# Patient Record
Sex: Female | Born: 1975 | Race: Black or African American | Hispanic: No | Smoking: Never smoker
Health system: Southern US, Community
[De-identification: ages and names within clinical notes are randomized; demographics above are authoritative.]

## PROBLEM LIST (undated history)

## (undated) DIAGNOSIS — N83209 Unspecified ovarian cyst, unspecified side: Secondary | ICD-10-CM

## (undated) DIAGNOSIS — A749 Chlamydial infection, unspecified: Secondary | ICD-10-CM

## (undated) DIAGNOSIS — N809 Endometriosis, unspecified: Secondary | ICD-10-CM

## (undated) DIAGNOSIS — R102 Pelvic and perineal pain: Secondary | ICD-10-CM

## (undated) DIAGNOSIS — T8859XA Other complications of anesthesia, initial encounter: Secondary | ICD-10-CM

## (undated) DIAGNOSIS — A599 Trichomoniasis, unspecified: Secondary | ICD-10-CM

## (undated) DIAGNOSIS — D649 Anemia, unspecified: Secondary | ICD-10-CM

## (undated) DIAGNOSIS — T4145XA Adverse effect of unspecified anesthetic, initial encounter: Secondary | ICD-10-CM

## (undated) HISTORY — PX: WISDOM TOOTH EXTRACTION: SHX21

## (undated) HISTORY — PX: BREAST LUMPECTOMY: SHX2

## (undated) HISTORY — PX: BREAST SURGERY: SHX581

---

## 2003-06-11 ENCOUNTER — Encounter: Admission: RE | Admit: 2003-06-11 | Discharge: 2003-06-11 | Payer: Self-pay | Admitting: Internal Medicine

## 2003-07-22 ENCOUNTER — Encounter: Admission: RE | Admit: 2003-07-22 | Discharge: 2003-07-22 | Payer: Self-pay | Admitting: Internal Medicine

## 2003-08-16 ENCOUNTER — Encounter: Admission: RE | Admit: 2003-08-16 | Discharge: 2003-08-16 | Payer: Self-pay | Admitting: Internal Medicine

## 2003-08-18 ENCOUNTER — Encounter: Admission: RE | Admit: 2003-08-18 | Discharge: 2003-08-18 | Payer: Self-pay | Admitting: Internal Medicine

## 2003-08-24 ENCOUNTER — Encounter: Admission: RE | Admit: 2003-08-24 | Discharge: 2003-08-24 | Payer: Self-pay | Admitting: Internal Medicine

## 2003-09-08 ENCOUNTER — Encounter: Admission: RE | Admit: 2003-09-08 | Discharge: 2003-09-08 | Payer: Self-pay | Admitting: Internal Medicine

## 2004-05-17 ENCOUNTER — Ambulatory Visit: Payer: Self-pay | Admitting: Internal Medicine

## 2004-05-22 ENCOUNTER — Ambulatory Visit: Payer: Self-pay | Admitting: Internal Medicine

## 2004-10-12 ENCOUNTER — Ambulatory Visit: Payer: Self-pay | Admitting: Internal Medicine

## 2004-12-20 ENCOUNTER — Emergency Department (HOSPITAL_COMMUNITY): Admission: EM | Admit: 2004-12-20 | Discharge: 2004-12-20 | Payer: Self-pay | Admitting: Emergency Medicine

## 2004-12-28 ENCOUNTER — Ambulatory Visit: Payer: Self-pay | Admitting: Internal Medicine

## 2006-04-26 ENCOUNTER — Other Ambulatory Visit: Admission: RE | Admit: 2006-04-26 | Discharge: 2006-04-26 | Payer: Self-pay | Admitting: Family Medicine

## 2007-07-04 ENCOUNTER — Emergency Department (HOSPITAL_COMMUNITY): Admission: EM | Admit: 2007-07-04 | Discharge: 2007-07-04 | Payer: Self-pay | Admitting: Emergency Medicine

## 2007-11-12 ENCOUNTER — Emergency Department (HOSPITAL_COMMUNITY): Admission: EM | Admit: 2007-11-12 | Discharge: 2007-11-12 | Payer: Self-pay | Admitting: Emergency Medicine

## 2008-06-15 ENCOUNTER — Other Ambulatory Visit: Admission: RE | Admit: 2008-06-15 | Discharge: 2008-06-15 | Payer: Self-pay | Admitting: Obstetrics and Gynecology

## 2009-11-30 ENCOUNTER — Encounter: Admission: RE | Admit: 2009-11-30 | Discharge: 2009-11-30 | Payer: Self-pay | Admitting: Internal Medicine

## 2010-01-06 ENCOUNTER — Emergency Department (HOSPITAL_COMMUNITY)
Admission: EM | Admit: 2010-01-06 | Discharge: 2010-01-07 | Payer: Self-pay | Source: Home / Self Care | Admitting: Emergency Medicine

## 2010-04-29 ENCOUNTER — Ambulatory Visit (HOSPITAL_COMMUNITY): Admission: RE | Admit: 2010-04-29 | Discharge: 2010-04-29 | Payer: Self-pay | Admitting: Family Medicine

## 2010-08-15 ENCOUNTER — Emergency Department (HOSPITAL_COMMUNITY)
Admission: EM | Admit: 2010-08-15 | Discharge: 2010-08-15 | Payer: Self-pay | Source: Home / Self Care | Admitting: Emergency Medicine

## 2010-10-23 LAB — URINALYSIS, ROUTINE W REFLEX MICROSCOPIC
Bilirubin Urine: NEGATIVE
Glucose, UA: NEGATIVE mg/dL
Hgb urine dipstick: NEGATIVE
Ketones, ur: NEGATIVE mg/dL
Nitrite: NEGATIVE
Protein, ur: NEGATIVE mg/dL
Specific Gravity, Urine: 1.031 — ABNORMAL HIGH (ref 1.005–1.030)
Urobilinogen, UA: 1 mg/dL (ref 0.0–1.0)
pH: 6.5 (ref 5.0–8.0)

## 2010-10-23 LAB — WET PREP, GENITAL
Clue Cells Wet Prep HPF POC: NONE SEEN
Trich, Wet Prep: NONE SEEN
Yeast Wet Prep HPF POC: NONE SEEN

## 2010-10-23 LAB — GC/CHLAMYDIA PROBE AMP, GENITAL
Chlamydia, DNA Probe: NEGATIVE
GC Probe Amp, Genital: NEGATIVE

## 2010-10-23 LAB — POCT PREGNANCY, URINE: Preg Test, Ur: NEGATIVE

## 2010-10-30 LAB — CBC
HCT: 35.8 % — ABNORMAL LOW (ref 36.0–46.0)
Hemoglobin: 11.1 g/dL — ABNORMAL LOW (ref 12.0–15.0)
MCHC: 30.9 g/dL (ref 30.0–36.0)
MCV: 73.4 fL — ABNORMAL LOW (ref 78.0–100.0)
Platelets: 261 10*3/uL (ref 150–400)
RBC: 4.88 MIL/uL (ref 3.87–5.11)
RDW: 20.7 % — ABNORMAL HIGH (ref 11.5–15.5)
WBC: 11.2 10*3/uL — ABNORMAL HIGH (ref 4.0–10.5)

## 2010-10-30 LAB — URINALYSIS, ROUTINE W REFLEX MICROSCOPIC
Glucose, UA: NEGATIVE mg/dL
Glucose, UA: NEGATIVE mg/dL
Hgb urine dipstick: NEGATIVE
Ketones, ur: NEGATIVE mg/dL
Leukocytes, UA: NEGATIVE
Nitrite: NEGATIVE
Nitrite: NEGATIVE
Protein, ur: NEGATIVE mg/dL
Protein, ur: NEGATIVE mg/dL
Specific Gravity, Urine: 1.035 — ABNORMAL HIGH (ref 1.005–1.030)
Specific Gravity, Urine: 1.038 — ABNORMAL HIGH (ref 1.005–1.030)
Urobilinogen, UA: 1 mg/dL (ref 0.0–1.0)
Urobilinogen, UA: 1 mg/dL (ref 0.0–1.0)
pH: 6 (ref 5.0–8.0)
pH: 6 (ref 5.0–8.0)

## 2010-10-30 LAB — DIFFERENTIAL
Basophils Absolute: 0.1 10*3/uL (ref 0.0–0.1)
Basophils Relative: 1 % (ref 0–1)
Eosinophils Absolute: 0.1 10*3/uL (ref 0.0–0.7)
Eosinophils Relative: 1 % (ref 0–5)
Lymphocytes Relative: 26 % (ref 12–46)
Lymphs Abs: 3 10*3/uL (ref 0.7–4.0)
Monocytes Absolute: 0.8 10*3/uL (ref 0.1–1.0)
Monocytes Relative: 7 % (ref 3–12)
Neutro Abs: 7.3 10*3/uL (ref 1.7–7.7)
Neutrophils Relative %: 65 % (ref 43–77)

## 2010-10-30 LAB — WET PREP, GENITAL
Trich, Wet Prep: NONE SEEN
WBC, Wet Prep HPF POC: NONE SEEN
Yeast Wet Prep HPF POC: NONE SEEN

## 2010-10-30 LAB — URINE MICROSCOPIC-ADD ON

## 2010-10-30 LAB — POCT PREGNANCY, URINE: Preg Test, Ur: NEGATIVE

## 2010-10-30 LAB — GC/CHLAMYDIA PROBE AMP, GENITAL
Chlamydia, DNA Probe: NEGATIVE
GC Probe Amp, Genital: NEGATIVE

## 2010-10-30 LAB — RPR: RPR Ser Ql: NONREACTIVE

## 2011-05-22 LAB — PREGNANCY, URINE: Preg Test, Ur: NEGATIVE

## 2011-05-22 LAB — CBC
HCT: 34.4 — ABNORMAL LOW
Hemoglobin: 10.8 — ABNORMAL LOW
MCHC: 31.4
Platelets: 286
RDW: 17.7 — ABNORMAL HIGH

## 2011-05-22 LAB — BASIC METABOLIC PANEL
BUN: 10
CO2: 25
Calcium: 8.9
Glucose, Bld: 81
Sodium: 139

## 2011-05-22 LAB — URINALYSIS, ROUTINE W REFLEX MICROSCOPIC
Bilirubin Urine: NEGATIVE
Glucose, UA: NEGATIVE
Hgb urine dipstick: NEGATIVE
Ketones, ur: NEGATIVE
Nitrite: NEGATIVE
Protein, ur: NEGATIVE
Specific Gravity, Urine: 1.029
Urobilinogen, UA: 1
pH: 5.5

## 2011-05-22 LAB — URINE MICROSCOPIC-ADD ON

## 2011-05-22 LAB — DIFFERENTIAL
Basophils Absolute: 0.1
Basophils Relative: 1
Eosinophils Relative: 1
Monocytes Absolute: 0.8
Neutro Abs: 7

## 2011-05-22 LAB — GC/CHLAMYDIA PROBE AMP, GENITAL: GC Probe Amp, Genital: NEGATIVE

## 2011-05-22 LAB — WET PREP, GENITAL

## 2011-06-05 ENCOUNTER — Emergency Department (HOSPITAL_COMMUNITY)
Admission: EM | Admit: 2011-06-05 | Discharge: 2011-06-05 | Disposition: A | Discharge status: Home / Self Care | Payer: PRIVATE HEALTH INSURANCE | Attending: Emergency Medicine | Admitting: Emergency Medicine

## 2011-06-05 DIAGNOSIS — F411 Generalized anxiety disorder: Secondary | ICD-10-CM | POA: Insufficient documentation

## 2011-06-05 DIAGNOSIS — I498 Other specified cardiac arrhythmias: Secondary | ICD-10-CM | POA: Insufficient documentation

## 2011-06-05 LAB — URINALYSIS, ROUTINE W REFLEX MICROSCOPIC
Glucose, UA: NEGATIVE mg/dL
Hgb urine dipstick: NEGATIVE
pH: 6 (ref 5.0–8.0)

## 2012-02-01 ENCOUNTER — Inpatient Hospital Stay (HOSPITAL_COMMUNITY)
Admission: AD | Admit: 2012-02-01 | Discharge: 2012-02-01 | Disposition: A | Discharge status: Home / Self Care | Payer: No Typology Code available for payment source | Source: Ambulatory Visit | Attending: Obstetrics & Gynecology | Admitting: Obstetrics & Gynecology

## 2012-02-01 ENCOUNTER — Encounter (HOSPITAL_COMMUNITY): Payer: Self-pay | Admitting: *Deleted

## 2012-02-01 DIAGNOSIS — R102 Pelvic and perineal pain: Secondary | ICD-10-CM

## 2012-02-01 DIAGNOSIS — R109 Unspecified abdominal pain: Secondary | ICD-10-CM | POA: Insufficient documentation

## 2012-02-01 DIAGNOSIS — N949 Unspecified condition associated with female genital organs and menstrual cycle: Secondary | ICD-10-CM | POA: Insufficient documentation

## 2012-02-01 LAB — URINALYSIS, ROUTINE W REFLEX MICROSCOPIC
Glucose, UA: NEGATIVE mg/dL
Hgb urine dipstick: NEGATIVE
Specific Gravity, Urine: >1.03 — ABNORMAL HIGH (ref 1.005–1.030)
Urobilinogen, UA: 1 mg/dL (ref 0.0–1.0)
pH: 6 (ref 5.0–8.0)

## 2012-02-01 LAB — POCT PREGNANCY, URINE: Preg Test, Ur: NEGATIVE

## 2012-02-01 MED ORDER — KETOROLAC TROMETHAMINE 60 MG/2ML IM SOLN
60.0000 mg | Freq: Once | INTRAMUSCULAR | Status: AC
  Administered 2012-02-01: 60 mg via INTRAMUSCULAR
  Filled 2012-02-01: qty 2

## 2012-02-01 MED ORDER — KETOPROFEN 50 MG PO CAPS: 50.0000 mg | ORAL_CAPSULE | Freq: Four times a day (QID) | ORAL | Status: AC | PRN

## 2012-02-01 NOTE — MAU Note (Signed)
Pt says l abd pain started 2 years ago- come/ go.     TAKES MEDS TO MAKE CYCLE- START.  AFTER CYCLE FINISHES - SHE HAS BAD CRAMPS.  Marland Kitchen LMP- 6-5 -2013 TONIGHT- TOOK TRAMADOL FOR PAIN-   NO RELIEF.Marland Kitchen  SHE CALLED OFFICE ON WED- NOONE RETURNED  THE CALL.  NOW PAIN IN LOWER ABD.Marland Kitchen  HAS BEEN TOLD HER TUBES ARE BLOCKED.

## 2012-02-01 NOTE — MAU Note (Signed)
Pt reports pain left lower abd x 1 month, being treated by office. Talked to MD today, naproxin, toradol have not helped. LMP 01/16/2012, pain is worse with periods.

## 2012-02-01 NOTE — MAU Provider Note (Signed)
  History     CSN: [id number]  Arrival date and time: 02/01/12 0016   None     Chief Complaint  Patient presents with  . Abdominal Pain   HPI 36 yo, here for pelvic pain. Seen in office several times for pelvic pain. Here today since no medication at home working (tried Ibuprofen, Ketoprofen, Tramadol). Pain was bilateral in past, usually only after menses, now more on left side and persistent pain, LMP 7 days back, pain started after menses.  Has infertility as well, suspect endometriosis.   No past medical history on file.  No past surgical history on file.  No family history on file.  History  Substance Use Topics  . Smoking status: Not on file  . Smokeless tobacco: Not on file  . Alcohol Use: Not on file   Allergies:  Allergies  Allergen Reactions  . Codeine Itching   Prescriptions prior to admission  Medication Sig Dispense Refill  . ferrous fumarate (HEMOCYTE - 106 MG FE) 325 (106 FE) MG TABS Take 1 tablet by mouth.      . Multiple Vitamin (MULTIVITAMIN) tablet Take 1 tablet by mouth daily.        ROS Physical Exam   Blood pressure 127/55, pulse 89, temperature 98.8 F (37.1 C), temperature source Oral, resp. rate 20, height 5\' 3"  (1.6 m), weight 82.101 kg (181 lb), last menstrual period 01/16/2012, SpO2 98.00%.  Physical Exam Physical exam:  A&O x 3, no acute distress, much better since Toradol 60 mg IM.  Abdo soft, non tender, non acute Extr no edema/ tenderness Pelvic deferred.  MAU Course  Procedures IM Toradol 60 mg x once  Assessment and Plan  Recurrent pelvic pain, worse after menses, suspect endometriosis, recent office sono noted bilateral cornual collection, endometriosis.  Will see me in office in 1 wk for pre-op and possible sono.   Jazzmon Prindle R 02/01/2012, 4:56 AM

## 2012-02-01 NOTE — Discharge Instructions (Signed)

## 2012-02-16 ENCOUNTER — Encounter (HOSPITAL_COMMUNITY): Payer: Self-pay | Admitting: Pharmacist

## 2012-02-22 ENCOUNTER — Other Ambulatory Visit: Payer: Self-pay | Admitting: Obstetrics & Gynecology

## 2012-02-22 ENCOUNTER — Inpatient Hospital Stay (HOSPITAL_COMMUNITY): Admission: RE | Admit: 2012-02-22 | Payer: No Typology Code available for payment source | Source: Ambulatory Visit

## 2012-02-26 ENCOUNTER — Encounter (HOSPITAL_COMMUNITY): Payer: Self-pay

## 2012-02-26 ENCOUNTER — Encounter (HOSPITAL_COMMUNITY)
Admission: RE | Admit: 2012-02-26 | Discharge: 2012-02-26 | Disposition: A | Discharge status: Home / Self Care | Payer: No Typology Code available for payment source | Source: Ambulatory Visit | Attending: Obstetrics & Gynecology | Admitting: Obstetrics & Gynecology

## 2012-02-26 HISTORY — DX: Anemia, unspecified: D64.9

## 2012-02-26 LAB — COMPREHENSIVE METABOLIC PANEL
AST: 18 U/L (ref 0–37)
Albumin: 3.6 g/dL (ref 3.5–5.2)
Calcium: 9.3 mg/dL (ref 8.4–10.5)
Creatinine, Ser: 0.71 mg/dL (ref 0.50–1.10)
GFR calc non Af Amer: >90 mL/min (ref >90)

## 2012-02-26 LAB — CBC
MCH: 22.1 pg — ABNORMAL LOW (ref 26.0–34.0)
MCHC: 30.5 g/dL (ref 30.0–36.0)
MCV: 72.4 fL — ABNORMAL LOW (ref 78.0–100.0)
Platelets: 250 10*3/uL (ref 150–400)
RDW: 18.2 % — ABNORMAL HIGH (ref 11.5–15.5)

## 2012-02-26 NOTE — Patient Instructions (Signed)
YOUR PROCEDURE IS SCHEDULED ON:02/29/12  ENTER THROUGH THE MAIN ENTRANCE OF [facility] AT:1200pm  USE DESK PHONE AND DIAL [phone] TO INFORM us OF YOUR ARRIVAL  CALL [phone] IF YOU HAVE ANY QUESTIONS OR PROBLEMS PRIOR TO YOUR ARRIVAL.  REMEMBER: DO NOT EAT AFTER MIDNIGHT :Thursday   SPECIAL INSTRUCTIONS:refer to clear liquids sheet   YOU MAY BRUSH YOUR TEETH THE MORNING OF SURGERY   TAKE THESE MEDICINES THE DAY OF SURGERY WITH SIP OF WATER:none   DO NOT WEAR JEWELRY, EYE MAKEUP, LIPSTICK OR DARK FINGERNAIL POLISH DO NOT WEAR LOTIONS  DO NOT SHAVE FOR 48 HOURS PRIOR TO SURGERY  YOU WILL NOT BE ALLOWED TO DRIVE YOURSELF HOME.

## 2012-02-29 ENCOUNTER — Encounter (HOSPITAL_COMMUNITY)
Admission: RE | Disposition: A | Discharge status: Home / Self Care | Payer: Self-pay | Source: Ambulatory Visit | Attending: Obstetrics & Gynecology

## 2012-02-29 ENCOUNTER — Encounter (HOSPITAL_COMMUNITY): Payer: Self-pay | Admitting: Obstetrics & Gynecology

## 2012-02-29 ENCOUNTER — Encounter (HOSPITAL_COMMUNITY): Payer: Self-pay | Admitting: Anesthesiology

## 2012-02-29 ENCOUNTER — Encounter (HOSPITAL_COMMUNITY): Payer: Self-pay | Admitting: *Deleted

## 2012-02-29 ENCOUNTER — Ambulatory Visit (HOSPITAL_COMMUNITY): Payer: No Typology Code available for payment source | Admitting: Anesthesiology

## 2012-02-29 ENCOUNTER — Ambulatory Visit (HOSPITAL_COMMUNITY)
Admission: RE | Admit: 2012-02-29 | Discharge: 2012-02-29 | Disposition: A | Discharge status: Home / Self Care | Payer: No Typology Code available for payment source | Source: Ambulatory Visit | Attending: Obstetrics & Gynecology | Admitting: Obstetrics & Gynecology

## 2012-02-29 DIAGNOSIS — Z01812 Encounter for preprocedural laboratory examination: Secondary | ICD-10-CM | POA: Insufficient documentation

## 2012-02-29 DIAGNOSIS — Z01818 Encounter for other preprocedural examination: Secondary | ICD-10-CM | POA: Insufficient documentation

## 2012-02-29 DIAGNOSIS — N803 Endometriosis of pelvic peritoneum, unspecified: Secondary | ICD-10-CM | POA: Insufficient documentation

## 2012-02-29 DIAGNOSIS — R1032 Left lower quadrant pain: Secondary | ICD-10-CM | POA: Insufficient documentation

## 2012-02-29 DIAGNOSIS — R102 Pelvic and perineal pain unspecified side: Secondary | ICD-10-CM | POA: Diagnosis present

## 2012-02-29 DIAGNOSIS — N920 Excessive and frequent menstruation with regular cycle: Secondary | ICD-10-CM | POA: Insufficient documentation

## 2012-02-29 DIAGNOSIS — N809 Endometriosis, unspecified: Secondary | ICD-10-CM

## 2012-02-29 DIAGNOSIS — R1031 Right lower quadrant pain: Secondary | ICD-10-CM | POA: Insufficient documentation

## 2012-02-29 DIAGNOSIS — N946 Dysmenorrhea, unspecified: Secondary | ICD-10-CM | POA: Insufficient documentation

## 2012-02-29 DIAGNOSIS — N949 Unspecified condition associated with female genital organs and menstrual cycle: Secondary | ICD-10-CM | POA: Insufficient documentation

## 2012-02-29 HISTORY — DX: Endometriosis, unspecified: N80.9

## 2012-02-29 HISTORY — DX: Pelvic and perineal pain: R10.2

## 2012-02-29 HISTORY — PX: ENDOMETRIAL ABLATION: SHX621

## 2012-02-29 HISTORY — PX: LAPAROSCOPY: SHX197

## 2012-02-29 HISTORY — PX: DILATION AND CURETTAGE OF UTERUS: SHX78

## 2012-02-29 SURGERY — LAPAROSCOPY OPERATIVE
Anesthesia: General | Site: Vagina | Wound class: Clean Contaminated

## 2012-02-29 MED ORDER — KETOROLAC TROMETHAMINE 30 MG/ML IJ SOLN: 15.0000 mg | Freq: Once | INTRAMUSCULAR | Status: DC | PRN

## 2012-02-29 MED ORDER — FENTANYL CITRATE 0.05 MG/ML IJ SOLN
INTRAMUSCULAR | Status: AC
  Filled 2012-02-29: qty 5

## 2012-02-29 MED ORDER — HYDROCODONE-ACETAMINOPHEN 5-325 MG PO TABS
ORAL_TABLET | ORAL | Status: AC
  Filled 2012-02-29: qty 1

## 2012-02-29 MED ORDER — MIDAZOLAM HCL 2 MG/2ML IJ SOLN
INTRAMUSCULAR | Status: AC
  Filled 2012-02-29: qty 2

## 2012-02-29 MED ORDER — CEFAZOLIN SODIUM-DEXTROSE 2-3 GM-% IV SOLR
INTRAVENOUS | Status: AC
  Filled 2012-02-29: qty 50

## 2012-02-29 MED ORDER — NEOSTIGMINE METHYLSULFATE 1 MG/ML IJ SOLN
INTRAMUSCULAR | Status: DC | PRN
  Administered 2012-02-29: 3 mg via INTRAVENOUS

## 2012-02-29 MED ORDER — DEXAMETHASONE SODIUM PHOSPHATE 10 MG/ML IJ SOLN
INTRAMUSCULAR | Status: AC
  Filled 2012-02-29: qty 1

## 2012-02-29 MED ORDER — KETOROLAC TROMETHAMINE 60 MG/2ML IM SOLN
INTRAMUSCULAR | Status: AC
  Filled 2012-02-29: qty 2

## 2012-02-29 MED ORDER — BUPIVACAINE HCL (PF) 0.25 % IJ SOLN
INTRAMUSCULAR | Status: DC | PRN
  Administered 2012-02-29: 5 mL

## 2012-02-29 MED ORDER — ROCURONIUM BROMIDE 50 MG/5ML IV SOLN
INTRAVENOUS | Status: AC
  Filled 2012-02-29: qty 1

## 2012-02-29 MED ORDER — NEOSTIGMINE METHYLSULFATE 1 MG/ML IJ SOLN
INTRAMUSCULAR | Status: AC
  Filled 2012-02-29: qty 10

## 2012-02-29 MED ORDER — GLYCOPYRROLATE 0.2 MG/ML IJ SOLN
INTRAMUSCULAR | Status: DC | PRN
  Administered 2012-02-29: 0.6 mg via INTRAVENOUS

## 2012-02-29 MED ORDER — GLYCOPYRROLATE 0.2 MG/ML IJ SOLN
INTRAMUSCULAR | Status: AC
  Filled 2012-02-29: qty 2

## 2012-02-29 MED ORDER — METHYLENE BLUE 1 % INJ SOLN
INTRAMUSCULAR | Status: DC | PRN
  Administered 2012-02-29: 1 mL

## 2012-02-29 MED ORDER — ROCURONIUM BROMIDE 100 MG/10ML IV SOLN
INTRAVENOUS | Status: DC | PRN
  Administered 2012-02-29: 50 mg via INTRAVENOUS

## 2012-02-29 MED ORDER — PROPOFOL 10 MG/ML IV EMUL
INTRAVENOUS | Status: AC
  Filled 2012-02-29: qty 20

## 2012-02-29 MED ORDER — OXYCODONE-ACETAMINOPHEN 5-325 MG PO TABS: 2.0000 | ORAL_TABLET | Freq: Four times a day (QID) | ORAL | Status: DC | PRN

## 2012-02-29 MED ORDER — FLUMAZENIL 1 MG/10ML IV SOLN
INTRAVENOUS | Status: AC
  Filled 2012-02-29: qty 10

## 2012-02-29 MED ORDER — BUPIVACAINE HCL (PF) 0.25 % IJ SOLN
INTRAMUSCULAR | Status: AC
  Filled 2012-02-29: qty 30

## 2012-02-29 MED ORDER — KETOROLAC TROMETHAMINE 60 MG/2ML IM SOLN
INTRAMUSCULAR | Status: DC | PRN
  Administered 2012-02-29: 30 mg via INTRAMUSCULAR

## 2012-02-29 MED ORDER — NALOXONE HCL 0.4 MG/ML IJ SOLN
INTRAMUSCULAR | Status: DC | PRN
  Administered 2012-02-29: 50 ug via INTRAVENOUS
  Administered 2012-02-29: 100 ug via INTRAVENOUS
  Administered 2012-02-29: 50 ug via INTRAVENOUS

## 2012-02-29 MED ORDER — KETOROLAC TROMETHAMINE 30 MG/ML IJ SOLN
INTRAMUSCULAR | Status: DC | PRN
  Administered 2012-02-29: 30 mg via INTRAVENOUS

## 2012-02-29 MED ORDER — ONDANSETRON HCL 4 MG/2ML IJ SOLN
INTRAMUSCULAR | Status: AC
  Filled 2012-02-29: qty 2

## 2012-02-29 MED ORDER — LIDOCAINE HCL (CARDIAC) 20 MG/ML IV SOLN
INTRAVENOUS | Status: AC
  Filled 2012-02-29: qty 5

## 2012-02-29 MED ORDER — HYDROCODONE-ACETAMINOPHEN 5-500 MG PO TABS: 1.0000 | ORAL_TABLET | Freq: Four times a day (QID) | ORAL | Status: AC | PRN

## 2012-02-29 MED ORDER — HYDROCODONE-ACETAMINOPHEN 5-325 MG PO TABS: 1.0000 | ORAL_TABLET | Freq: Once | ORAL | Status: DC

## 2012-02-29 MED ORDER — CEFAZOLIN SODIUM-DEXTROSE 2-3 GM-% IV SOLR
2.0000 g | INTRAVENOUS | Status: AC
  Administered 2012-02-29: 2 g via INTRAVENOUS

## 2012-02-29 MED ORDER — DEXAMETHASONE SODIUM PHOSPHATE 10 MG/ML IJ SOLN
INTRAMUSCULAR | Status: DC | PRN
  Administered 2012-02-29: 10 mg via INTRAVENOUS

## 2012-02-29 MED ORDER — ONDANSETRON HCL 4 MG/2ML IJ SOLN
INTRAMUSCULAR | Status: DC | PRN
  Administered 2012-02-29: 4 mg via INTRAVENOUS

## 2012-02-29 MED ORDER — FENTANYL CITRATE 0.05 MG/ML IJ SOLN
INTRAMUSCULAR | Status: DC | PRN
  Administered 2012-02-29 (×2): 50 ug via INTRAVENOUS
  Administered 2012-02-29: 100 ug via INTRAVENOUS
  Administered 2012-02-29: 50 ug via INTRAVENOUS

## 2012-02-29 MED ORDER — FLUMAZENIL 0.5 MG/5ML IV SOLN
INTRAVENOUS | Status: DC | PRN
  Administered 2012-02-29 (×2): 0.2 mg via INTRAVENOUS

## 2012-02-29 MED ORDER — MIDAZOLAM HCL 5 MG/5ML IJ SOLN
INTRAMUSCULAR | Status: DC | PRN
  Administered 2012-02-29: 2 mg via INTRAVENOUS

## 2012-02-29 MED ORDER — FENTANYL CITRATE 0.05 MG/ML IJ SOLN: 25.0000 ug | INTRAMUSCULAR | Status: DC | PRN

## 2012-02-29 MED ORDER — METHYLENE BLUE 1 % INJ SOLN
INTRAMUSCULAR | Status: AC
  Filled 2012-02-29: qty 1

## 2012-02-29 MED ORDER — LIDOCAINE HCL (CARDIAC) 20 MG/ML IV SOLN
INTRAVENOUS | Status: DC | PRN
  Administered 2012-02-29: 40 mg via INTRAVENOUS

## 2012-02-29 MED ORDER — LACTATED RINGERS IV SOLN
INTRAVENOUS | Status: DC
  Administered 2012-02-29 (×2): via INTRAVENOUS

## 2012-02-29 MED ORDER — NALOXONE HCL 0.4 MG/ML IJ SOLN
INTRAMUSCULAR | Status: AC
  Filled 2012-02-29: qty 1

## 2012-02-29 MED ORDER — PROPOFOL 10 MG/ML IV EMUL
INTRAVENOUS | Status: DC | PRN
  Administered 2012-02-29: 20 mg via INTRAVENOUS
  Administered 2012-02-29: 150 mg via INTRAVENOUS

## 2012-02-29 SURGICAL SUPPLY — 33 items
ADH SKN CLS APL DERMABOND .7 (GAUZE/BANDAGES/DRESSINGS) ×2
BAG SPEC RTRVL LRG 6X4 10 (ENDOMECHANICALS)
BARRIER ADHS 3X4 INTERCEED (GAUZE/BANDAGES/DRESSINGS) ×1 IMPLANT
BRR ADH 4X3 ABS CNTRL BYND (GAUZE/BANDAGES/DRESSINGS) ×2
CABLE HIGH FREQUENCY MONO STRZ (ELECTRODE) ×1 IMPLANT
CATH ROBINSON RED A/P 16FR (CATHETERS) ×3 IMPLANT
CHLORAPREP W/TINT 26ML (MISCELLANEOUS) ×3 IMPLANT
CLOTH BEACON ORANGE TIMEOUT ST (SAFETY) ×3 IMPLANT
DERMABOND ADVANCED (GAUZE/BANDAGES/DRESSINGS) ×1
DERMABOND ADVANCED .7 DNX12 (GAUZE/BANDAGES/DRESSINGS) ×2 IMPLANT
EVACUATOR SMOKE 8.L (FILTER) IMPLANT
GLOVE BIO SURGEON STRL SZ7 (GLOVE) ×3 IMPLANT
GLOVE BIOGEL PI IND STRL 7.0 (GLOVE) ×4 IMPLANT
GLOVE BIOGEL PI INDICATOR 7.0 (GLOVE) ×2
GOWN PREVENTION PLUS LG XLONG (DISPOSABLE) ×9 IMPLANT
MANIPULATOR UTERINE 4.5 ZUMI (MISCELLANEOUS) ×3 IMPLANT
NDL INSUFFLATION 14GA 120MM (NEEDLE) IMPLANT
NEEDLE INSUFFLATION 14GA 120MM (NEEDLE) IMPLANT
NS IRRIG 1000ML POUR BTL (IV SOLUTION) ×3 IMPLANT
PACK LAPAROSCOPY BASIN (CUSTOM PROCEDURE TRAY) ×3 IMPLANT
POUCH SPECIMEN RETRIEVAL 10MM (ENDOMECHANICALS) IMPLANT
PROTECTOR NERVE ULNAR (MISCELLANEOUS) ×4 IMPLANT
SCISSORS LAP 5X35 DISP (ENDOMECHANICALS) ×1 IMPLANT
SET IRRIG TUBING LAPAROSCOPIC (IRRIGATION / IRRIGATOR) IMPLANT
SUT VICRYL 0 UR6 27IN ABS (SUTURE) ×4 IMPLANT
SUT VICRYL 4-0 PS2 18IN ABS (SUTURE) ×3 IMPLANT
SYR 50ML LL SCALE MARK (SYRINGE) ×2 IMPLANT
TOWEL OR 17X24 6PK STRL BLUE (TOWEL DISPOSABLE) ×6 IMPLANT
TROCAR BALLN 12MMX100 BLUNT (TROCAR) IMPLANT
TROCAR XCEL NON-BLD 11X100MML (ENDOMECHANICALS) ×1 IMPLANT
TROCAR XCEL NON-BLD 5MMX100MML (ENDOMECHANICALS) ×1 IMPLANT
WARMER LAPAROSCOPE (MISCELLANEOUS) ×3 IMPLANT
WATER STERILE IRR 1000ML POUR (IV SOLUTION) ×3 IMPLANT

## 2012-02-29 NOTE — H&P (Signed)
Lori Andrews is an 36 y.o. female G0. Pelvic pain, esp around menses, now progressively worse and almost daily pain, but pain off and on since 2011, prior ruptured ovarian cysts but no surgery needed. Long standing irreg menses/ amenorrhea and gets menses with progestin withdrawal only, that is when her pain if worse. Anovulatory cycles, infertility,? PCOS. Not sure if tubes patent, but no prior PID/endometriosis diagnosis, no dyspareunia.  Normal Paps in past, last Mar'13 -nl  Infertility labs nl, except low progesterone.   No breast complaint, remote h/o benign breast lump excision.    Patient's last menstrual period was 01/16/2012.    Past Medical History  Diagnosis Date  . Heart murmur     occ rapid beats- pt thinks related to caffeine use  . Anemia     Past Surgical History  Procedure Date  . Breast surgery     History reviewed. No pertinent family history.  Social History:  reports that she has never smoked. She does not have any smokeless tobacco history on file. She reports that she drinks alcohol. She reports that she does not use illicit drugs.  Allergies:  Allergies  Allergen Reactions  . Codeine Itching  . Red Dye Itching    Prescriptions prior to admission  Medication Sig Dispense Refill  . ferrous fumarate (HEMOCYTE - 106 MG FE) 325 (106 FE) MG TABS Take 1 tablet by mouth daily as needed. Takes when iron is low, pt states that she takes when she feels extremely sluggish      . doxycycline (VIBRA-TABS) 100 MG tablet Take 100 mg by mouth 2 (two) times daily. 7 days course, pt still taking due to gi upset & wasn't able to take for a few days      . ketoprofen (ORUDIS) 50 MG capsule Take 1 capsule (50 mg total) by mouth 4 (four) times daily as needed for pain.  30 capsule  0  . metroNIDAZOLE (FLAGYL) 500 MG tablet Take 500 mg by mouth 2 (two) times daily. 7 day course, pt still taking due to gi upset & wasn't able to take for a few days      . Multiple Vitamin  (MULTIVITAMIN) tablet Take 1 tablet by mouth daily.      . naproxen sodium (ANAPROX) 220 MG tablet Take 220 mg by mouth daily as needed. For pain        Review of Systems  Constitutional: Negative for fever.  Cardiovascular: Negative for chest pain.  Genitourinary: Negative for dysuria.  Skin: Negative for rash.  Neurological: Negative for dizziness and headaches.    Blood pressure 125/70, pulse 91, temperature 98.1 F (36.7 C), temperature source Oral, resp. rate 18, last menstrual period 01/16/2012, SpO2 99.00%. Physical Exam  A&O x 3, no acute distress. Pleasant HEENT neg, no thyromegaly Lungs CTA bilat CV RRR, S1S2 normal Abdo soft, non tender, non acute Extr no edema/ tenderness Pelvic Uterus AV/NS/ no adnexal masses   Results for orders placed during the hospital encounter of 02/29/12 (from the past 24 hour(s))  PREGNANCY, URINE     Status: Normal   Collection Time   02/29/12 12:08 PM      Component Value Range   Preg Test, Ur NEGATIVE  NEGATIVE    No results found.  Assessment/Plan: Chronic pelvic pain, LLQ>RLQ, since 2011, now getting worse. Sono exxentially normal, possible hydro/hematosalpinx at cornua noted once when sono done during pain and menses.   Plan Laparoscopy, possible operative, chromopertubation, D&C.  Risks/complications of surgery reviewed  incl infection, bleeding, damage to internal organs including bladder, bowels, ureters, blood vessels, other risks from anesthesia, VTE and delayed complications of any surgery, complications in future surgery reviewed. Also reviewed possible need for future surgeries.   Kristjan Derner R 02/29/2012, 1:03 PM

## 2012-02-29 NOTE — Op Note (Signed)
Preoperative diagnosis: Chronic pelvic pain, Dysmenorrhea, Menorrhagia Postoperative diagnosis: Same, Pelvic endometriosis, Pelvic adhesions Procedure: Laparoscopic lysis of adhesions, ablation of endometriosis, chromopertubation, endometrial curettage Surgeon: Dr Shea Evans, MD Assistants: none Anesthesia Gen. Endotracheal IV fluids LR 1300 cc  EBL minimal, 10 cc Urine clear 50 cc straight cath pre-op Complications none Disposition PACU and home Specimen: Endometrial curettings Findings: Pelvic adhesions between anterior pelvic peritoneum and omentum and bowel, Endometriosis on uterus and anterior pelvic peritoneum and round ligaments. Normal ovaries, no posterior cul de sac endometriosis. Normal patent fallopian tubes. Normal appendix, liver.   Procedure Patient is 36 yo, G0 with chronic pelvic pain for about 2 yrs, now getting more frequent, worse with menses and pain is sometimes severe that needed Ed visit. She also has infertility and anovulation with menses only with progestin use. Risk and complications of surgery including infection, bleeding, damage to internal organs, other complications including pneumonia, VTE were reviewed. Patient voiced understanding. Informed written consent was obtained and patient was brought to the operating room with IV running. Timeout was carried out. She underwent general anesthesia without difficulty and was given dorsal lithotomy position with both arms tucked. 1 gm Ancef given. Patient was prepped and draped in standard fashion. Bladder emptied with straight catheter once, clear 50 cc urine.  Speculum placed, anterior lip grasped with tenaculum. Uterus sounded to 8 cm. Cervix dilated to 40F. Endometrial curetting performed and sent to pathology. Zumi manipulator was introduced in the uterine cavity and secured with balloon.  Gloves changed attention was focused on the abdomen. A 10 mm vertical incision was made at the umbilicus after injecting 4cc  Marcaine. Incision was carried down to the fascia, was incised, peritoneal entry made bluntly and confirmed. 10 mm trocar introduced, insufflation was begun with CO2. A 0 laparoscope was introduced. Patient was given Trendelenburg position.  Pelvic adhesions noted, omental adhesions to pelvic peritoneum covering uterus and adnexa. Liver, upper abdomen normal. Appendix normal. Anterior adhesions dissected with endoshears and fundus and the rest of the uterus was seen well. Endometriosis noted in anterior pelvis, on uterine serosa and lower anterior abdominal wall. Both tubes were without adhesions or endometriosis. Ovaries and ovarian fossa appeared normal without endometriosis. Chromopertubation performed and methylene blue dye seen spilled from both the fallopian tubes. Anterior surface of the uterus was adherent to utero-vesical peritoneal fold from endometriosis. With blunt and sharp dissection peritoneal adhesions separated, endometriosis ablated. Hemostasis excellent. Irrigation performed. Interceed placed in anterior cul de sac.  All instruments removed under vision, pneumoperitoneum deflated. Fascia closed with 0 Vicryl and both skin incisions was approximated using 3-0 Vicryl in subcuticular fashion. Dermabond was applied at the incision.  The ZUMI removed from the uterus. Hemostasis was excellent. All counts correct x 2. Patient was reversed from general anesthesia extubated and brought out to the recovery room in stable condition.  Plan is to discharge home from recovery room. Surgical findings were discussed with patient's family. Followup with Dr. Juliene Pina in office in 2 weeks.

## 2012-02-29 NOTE — Anesthesia Postprocedure Evaluation (Signed)
  Anesthesia Post-op Note  Patient: Lori Andrews  Procedure(s) Performed: Procedure(s) (LRB): LAPAROSCOPY OPERATIVE (N/A) DILATATION AND CURETTAGE (N/A) LYSIS OF ADHESION (N/A) ENDOMETRIAL ABLATION (N/A) Patient is awake and responsive. Pain and nausea are reasonably well controlled. Vital signs are stable and clinically acceptable. Oxygen saturation is clinically acceptable. There are no apparent anesthetic complications at this time. Patient is ready for discharge.

## 2012-02-29 NOTE — Progress Notes (Signed)
Pt complains of pain and requesting pain medication, discussed pts need for several doses of Narcan in the or and difficulty breathing post op, discussed issue with Dr. Rodman Pickle.  Order received to give po pain med prior to discharge after pt has been up to the bathroom and is ready for discharge.  Pt questioning why she cant receive iv pain meds and be allowed to sleep and stay in the PACU until later tonight.  Discussed discharge plans with patient and efficacy of po pain meds versus iv meds.   Attempting to void on bedpan now, will attempt to get pt out of bed in a few minutes if unable to void on bedpan.  Coke and crackers given per pt request.  Pain quiet at present painad scale 0.

## 2012-02-29 NOTE — Transfer of Care (Signed)
Immediate Anesthesia Transfer of Care Note  Patient: Lori Andrews  Procedure(s) Performed: Procedure(s) (LRB): LAPAROSCOPY OPERATIVE (N/A) DILATATION AND CURETTAGE (N/A) LYSIS OF ADHESION (N/A) ENDOMETRIAL ABLATION (N/A)  Patient Location: PACU  Anesthesia Type: General  Level of Consciousness: awake, sedated, patient cooperative, lethargic and responds to stimulation  Airway & Oxygen Therapy: Patient Spontanous Breathing and Patient connected to nasal cannula oxygen  Post-op Assessment: Report given to PACU RN and Post -op Vital signs reviewed and stable  Post vital signs: Reviewed and stable  Complications: No apparent anesthesia complications

## 2012-02-29 NOTE — Anesthesia Preprocedure Evaluation (Signed)
Anesthesia Evaluation  Patient identified by MRN, date of birth, ID band Patient awake    Reviewed: Allergy & Precautions, H&P , NPO status , Patient's Chart, lab work & pertinent test results, reviewed documented beta blocker date and time   History of Anesthesia Complications Negative for: history of anesthetic complications  Airway Mallampati: II TM Distance: >3 FB Neck ROM: full    Dental  (+) Teeth Intact Upper and lower braces:   Pulmonary neg pulmonary ROS,  breath sounds clear to auscultation  Pulmonary exam normal       Cardiovascular negative cardio ROS  Rhythm:regular Rate:Normal     Neuro/Psych negative neurological ROS  negative psych ROS   GI/Hepatic negative GI ROS, Neg liver ROS,   Endo/Other  negative endocrine ROS  Renal/GU negative Renal ROS  Female GU complaint     Musculoskeletal   Abdominal   Peds  Hematology negative hematology ROS (+)   Anesthesia Other Findings   Reproductive/Obstetrics negative OB ROS                           Anesthesia Physical Anesthesia Plan  ASA: II  Anesthesia Plan: General ETT   Post-op Pain Management:    Induction:   Airway Management Planned:   Additional Equipment:   Intra-op Plan:   Post-operative Plan:   Informed Consent: I have reviewed the patients History and Physical, chart, labs and discussed the procedure including the risks, benefits and alternatives for the proposed anesthesia with the patient or authorized representative who has indicated his/her understanding and acceptance.   Dental Advisory Given  Plan Discussed with: CRNA and Surgeon  Anesthesia Plan Comments:         Anesthesia Quick Evaluation

## 2012-02-29 NOTE — Progress Notes (Signed)
Pt states she can take Vicodin for pain Dr Juliene Pina made aware and prescription change

## 2012-03-03 ENCOUNTER — Encounter (HOSPITAL_COMMUNITY): Payer: Self-pay | Admitting: Obstetrics & Gynecology

## 2012-06-13 ENCOUNTER — Emergency Department (HOSPITAL_COMMUNITY)
Admission: EM | Admit: 2012-06-13 | Discharge: 2012-06-13 | Disposition: A | Discharge status: Home / Self Care | Payer: No Typology Code available for payment source | Attending: Emergency Medicine | Admitting: Emergency Medicine

## 2012-06-13 ENCOUNTER — Encounter (HOSPITAL_COMMUNITY): Payer: Self-pay | Admitting: Emergency Medicine

## 2012-06-13 DIAGNOSIS — Z8679 Personal history of other diseases of the circulatory system: Secondary | ICD-10-CM | POA: Insufficient documentation

## 2012-06-13 DIAGNOSIS — Y92410 Unspecified street and highway as the place of occurrence of the external cause: Secondary | ICD-10-CM | POA: Insufficient documentation

## 2012-06-13 DIAGNOSIS — M549 Dorsalgia, unspecified: Secondary | ICD-10-CM

## 2012-06-13 DIAGNOSIS — IMO0002 Reserved for concepts with insufficient information to code with codable children: Secondary | ICD-10-CM | POA: Insufficient documentation

## 2012-06-13 DIAGNOSIS — Y9389 Activity, other specified: Secondary | ICD-10-CM | POA: Insufficient documentation

## 2012-06-13 DIAGNOSIS — M129 Arthropathy, unspecified: Secondary | ICD-10-CM | POA: Insufficient documentation

## 2012-06-13 DIAGNOSIS — D649 Anemia, unspecified: Secondary | ICD-10-CM | POA: Insufficient documentation

## 2012-06-13 DIAGNOSIS — M545 Low back pain: Secondary | ICD-10-CM

## 2012-06-13 DIAGNOSIS — IMO0001 Reserved for inherently not codable concepts without codable children: Secondary | ICD-10-CM | POA: Insufficient documentation

## 2012-06-13 DIAGNOSIS — Z8742 Personal history of other diseases of the female genital tract: Secondary | ICD-10-CM | POA: Insufficient documentation

## 2012-06-13 DIAGNOSIS — Z79899 Other long term (current) drug therapy: Secondary | ICD-10-CM | POA: Insufficient documentation

## 2012-06-13 MED ORDER — HYDROCODONE-ACETAMINOPHEN 5-325 MG PO TABS: 1.0000 | ORAL_TABLET | Freq: Four times a day (QID) | ORAL | Status: DC | PRN

## 2012-06-13 MED ORDER — OXYCODONE-ACETAMINOPHEN 5-325 MG PO TABS
2.0000 | ORAL_TABLET | Freq: Once | ORAL | Status: AC
Start: 2012-06-13 — End: 2012-06-13
  Administered 2012-06-13: 2 via ORAL
  Filled 2012-06-13: qty 2

## 2012-06-13 MED ORDER — IBUPROFEN 600 MG PO TABS: 600.0000 mg | ORAL_TABLET | Freq: Three times a day (TID) | ORAL | Status: DC | PRN

## 2012-06-13 MED ORDER — METHOCARBAMOL 750 MG PO TABS: 750.0000 mg | ORAL_TABLET | Freq: Four times a day (QID) | ORAL | Status: DC | PRN
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
## 2012-06-16 ENCOUNTER — Encounter: Payer: Self-pay | Admitting: *Deleted

## 2012-06-16 ENCOUNTER — Ambulatory Visit: Payer: No Typology Code available for payment source

## 2012-06-16 ENCOUNTER — Ambulatory Visit (INDEPENDENT_AMBULATORY_CARE_PROVIDER_SITE_OTHER): Payer: No Typology Code available for payment source | Admitting: Family Medicine

## 2012-06-16 DIAGNOSIS — R52 Pain, unspecified: Secondary | ICD-10-CM

## 2012-06-16 MED ORDER — CYCLOBENZAPRINE HCL 10 MG PO TABS: 10.0000 mg | ORAL_TABLET | Freq: Two times a day (BID) | ORAL | Status: DC | PRN
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
## 2012-06-16 NOTE — ED Provider Notes (Signed)
Medical screening examination/treatment/procedure(s) were performed by non-physician practitioner and as supervising physician I was immediately available for consultation/collaboration.   Benny Lennert, MD 06/16/12 [phone]

## 2012-06-16 NOTE — Patient Instructions (Addendum)
Call me if not better in the next 2 days.

## 2012-06-18 ENCOUNTER — Telehealth: Payer: Self-pay

## 2012-06-18 NOTE — Telephone Encounter (Signed)
If no better, unable to work,still with severe pain needs to return to clinic. She will come in for recheck tomorrow.

## 2012-06-18 NOTE — Telephone Encounter (Signed)
Dr. Patsy Lager  Patient called to say she is not any better.  Tried to get dressed and go to work today, could not.  Please call [phone]

## 2012-06-19 ENCOUNTER — Ambulatory Visit: Payer: No Typology Code available for payment source

## 2012-06-19 ENCOUNTER — Ambulatory Visit (INDEPENDENT_AMBULATORY_CARE_PROVIDER_SITE_OTHER): Payer: No Typology Code available for payment source | Admitting: Family Medicine

## 2012-06-19 VITALS — BP 122/79 | HR 111 | Temp 98.3°F | Resp 16 | Ht 68.0 in | Wt 177.0 lb

## 2012-06-19 DIAGNOSIS — M549 Dorsalgia, unspecified: Secondary | ICD-10-CM
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
## 2012-08-28 ENCOUNTER — Encounter (HOSPITAL_COMMUNITY): Payer: Self-pay | Admitting: Family Medicine

## 2012-08-28 ENCOUNTER — Emergency Department (HOSPITAL_COMMUNITY)
Admission: EM | Admit: 2012-08-28 | Discharge: 2012-08-29 | Disposition: A | Discharge status: Home / Self Care | Payer: No Typology Code available for payment source | Attending: Emergency Medicine | Admitting: Emergency Medicine

## 2012-08-28 DIAGNOSIS — N949 Unspecified condition associated with female genital organs and menstrual cycle: Secondary | ICD-10-CM | POA: Insufficient documentation

## 2012-08-28 DIAGNOSIS — R209 Unspecified disturbances of skin sensation: Secondary | ICD-10-CM | POA: Insufficient documentation

## 2012-08-28 DIAGNOSIS — R110 Nausea: Secondary | ICD-10-CM | POA: Insufficient documentation

## 2012-08-28 DIAGNOSIS — N809 Endometriosis, unspecified: Secondary | ICD-10-CM

## 2012-08-28 DIAGNOSIS — D649 Anemia, unspecified: Secondary | ICD-10-CM | POA: Insufficient documentation

## 2012-08-28 DIAGNOSIS — R011 Cardiac murmur, unspecified: Secondary | ICD-10-CM | POA: Insufficient documentation

## 2012-08-28 DIAGNOSIS — R102 Pelvic and perineal pain: Secondary | ICD-10-CM

## 2012-08-28 NOTE — ED Notes (Addendum)
Patient states that she has had abdominal pain since Monday. Denies n/v/d. Denies dysuria. Had 2 BMs yesterday, one normal, one was similar to constipation. Indicates lower abdomen. States pain radiates into back and legs. Has taken Vicodin, Ketoprofen today. Yesterday took Hydrocodone and Ketoprofen and Advil. Meds makes her sleep but does not relieve pain. Reports that she has a history of endometriosis.

## 2012-08-29 LAB — CBC
HCT: 33.8 % — ABNORMAL LOW (ref 36.0–46.0)
Hemoglobin: 10.5 g/dL — ABNORMAL LOW (ref 12.0–15.0)
MCHC: 31.1 g/dL (ref 30.0–36.0)
RBC: 4.73 MIL/uL (ref 3.87–5.11)

## 2012-08-29 LAB — WET PREP, GENITAL
Clue Cells Wet Prep HPF POC: NONE SEEN
Trich, Wet Prep: NONE SEEN
Yeast Wet Prep HPF POC: NONE SEEN

## 2012-08-29 LAB — URINALYSIS, ROUTINE W REFLEX MICROSCOPIC
Bilirubin Urine: NEGATIVE
Glucose, UA: NEGATIVE mg/dL
Specific Gravity, Urine: 1.035 — ABNORMAL HIGH (ref 1.005–1.030)
pH: 6.5 (ref 5.0–8.0)

## 2012-08-29 LAB — PREGNANCY, URINE: Preg Test, Ur: NEGATIVE

## 2012-08-29 LAB — GC/CHLAMYDIA PROBE AMP
CT Probe RNA: NEGATIVE
GC Probe RNA: NEGATIVE

## 2012-08-29 LAB — BASIC METABOLIC PANEL
BUN: 13 mg/dL (ref 6–23)
CO2: 25 mEq/L (ref 19–32)
Chloride: 104 mEq/L (ref 96–112)
GFR calc non Af Amer: >90 mL/min (ref >90)
Glucose, Bld: 96 mg/dL (ref 70–99)
Potassium: 3.5 mEq/L (ref 3.5–5.1)

## 2012-08-29 MED ORDER — OXYCODONE-ACETAMINOPHEN 5-325 MG PO TABS: 1.0000 | ORAL_TABLET | Freq: Four times a day (QID) | ORAL | Status: DC | PRN

## 2012-08-29 MED ORDER — HYDROMORPHONE HCL PF 1 MG/ML IJ SOLN
1.0000 mg | Freq: Once | INTRAMUSCULAR | Status: AC
  Administered 2012-08-29: 1 mg via INTRAVENOUS
  Filled 2012-08-29: qty 1

## 2012-08-29 MED ORDER — MORPHINE SULFATE 4 MG/ML IJ SOLN
6.0000 mg | Freq: Once | INTRAMUSCULAR | Status: AC
  Administered 2012-08-29: 6 mg via INTRAVENOUS
  Filled 2012-08-29: qty 2

## 2012-08-29 MED ORDER — OXYCODONE-ACETAMINOPHEN 5-325 MG PO TABS
1.0000 | ORAL_TABLET | Freq: Once | ORAL | Status: AC
  Administered 2012-08-29: 1 via ORAL
  Filled 2012-08-29: qty 1

## 2012-08-29 MED ORDER — PROMETHAZINE HCL 25 MG PO TABS: 25.0000 mg | ORAL_TABLET | Freq: Four times a day (QID) | ORAL | Status: DC | PRN

## 2012-08-29 NOTE — ED Notes (Signed)
Pelvic cart to bedside 

## 2012-08-29 NOTE — ED Provider Notes (Signed)
History     CSN: [id number]  Arrival date & time 08/28/12  2309   First MD Initiated Contact with Patient 08/28/12 2357      Chief Complaint  Patient presents with  . Abdominal Pain    (Consider location/radiation/quality/duration/timing/severity/associated sxs/prior treatment) HPI Comments: Lori Andrews presents reporting abdominal discomfort.  She states she has a history of endometriosis and has had a previous exploratory laparotomy with removal of some scar tissue.  She takes an oral contraceptive pill to assist with chronic related pain.  For the last 4 days she states the discomfort has been more severe than usual.  Patient is a 37 y.o. female presenting with abdominal pain. The history is provided by the patient. No language interpreter was used.  Abdominal Pain The primary symptoms of the illness include abdominal pain, nausea and vaginal bleeding ("I'm on my period."). The primary symptoms of the illness do not include fever, fatigue, shortness of breath, vomiting, diarrhea, hematemesis, hematochezia, dysuria or vaginal discharge. The current episode started more than 2 days ago (4 days total). The onset of the illness was gradual. The problem has been gradually worsening.  Symptoms associated with the illness do not include chills, anorexia, diaphoresis, heartburn, constipation, urgency, hematuria, frequency or back pain.    Past Medical History  Diagnosis Date  . Heart murmur     occ rapid beats- pt thinks related to caffeine use  . Anemia   . Endometriosis 02/29/2012  . Pelvic pain in female 02/29/2012    Past Surgical History  Procedure Date  . Breast surgery   . Laparoscopy 02/29/2012    Procedure: LAPAROSCOPY OPERATIVE;  Surgeon: Robley Fries, MD;  Location: [facility];  Service: Gynecology;  Laterality: N/A;  w/ Chromopertubation  . Dilation and curettage of uterus 02/29/2012    Procedure: DILATATION AND CURETTAGE;  Surgeon: Robley Fries, MD;  Location: [facility];   Service: Gynecology;  Laterality: N/A;  . Endometrial ablation 02/29/2012    Procedure: ENDOMETRIAL ABLATION;  Surgeon: Robley Fries, MD;  Location: [facility];  Service: Gynecology;  Laterality: N/A;    Family History  Problem Relation Age of Onset  . Diabetes Mother   . Diabetes Father   . Hyperlipidemia Father   . Diabetes Sister     History  Substance Use Topics  . Smoking status: Never Smoker   . Smokeless tobacco: Not on file  . Alcohol Use: Yes     Comment: Occasionallly     OB History    Grav Para Term Preterm Abortions TAB SAB Ect Mult Living                  Review of Systems  Constitutional: Negative for fever, chills, diaphoresis and fatigue.  Respiratory: Negative for shortness of breath.   Gastrointestinal: Positive for nausea and abdominal pain. Negative for heartburn, vomiting, diarrhea, constipation, hematochezia, anorexia and hematemesis.  Genitourinary: Positive for vaginal bleeding ("I'm on my period."). Negative for dysuria, urgency, frequency, hematuria and vaginal discharge.  Musculoskeletal: Negative for back pain.  All other systems reviewed and are negative.    Allergies  Codeine and Red dye  Home Medications   Current Outpatient Rx  Name  Route  Sig  Dispense  Refill  . HYDROCODONE-ACETAMINOPHEN 5-325 MG PO TABS   Oral   Take 1 tablet by mouth every 6 (six) hours as needed for pain (Take 1 - 2 tablets every 4 - 6 hours.).   20 tablet   0   .  IBUPROFEN 600 MG PO TABS   Oral   Take 1 tablet (600 mg total) by mouth every 8 (eight) hours as needed for pain.   30 tablet   0   . KETOPROFEN 50 MG PO CAPS   Oral   Take 50 mg by mouth 4 (four) times daily as needed. For pain.         Marland Kitchen METHOCARBAMOL 750 MG PO TABS   Oral   Take 1 tablet (750 mg total) by mouth 4 (four) times daily as needed (Take 1 tablet every 6 hours as needed for muscle spasms.).   20 tablet   0   . NAPROXEN SODIUM 220 MG PO TABS   Oral   Take 220 mg by mouth 2  (two) times daily as needed. For pain         . PRESCRIPTION MEDICATION   Oral   Take 1 tablet by mouth daily. Birth Control (Norec B?)           BP 116/43  Pulse 96  Temp 98.3 F (36.8 C) (Oral)  Resp 15  SpO2 98%  LMP 08/25/2012  Physical Exam  Nursing note and vitals reviewed. Constitutional: She is oriented to person, place, and time. She appears well-developed and well-nourished. No distress.  HENT:  Head: Normocephalic and atraumatic.  Right Ear: External ear normal.  Left Ear: External ear normal.  Nose: Nose normal.  Mouth/Throat: Oropharynx is clear and moist. No oropharyngeal exudate.  Eyes: Conjunctivae normal are normal. Pupils are equal, round, and reactive to light. Right eye exhibits no discharge. Left eye exhibits no discharge. No scleral icterus.  Neck: Normal range of motion. Neck supple. No JVD present. No tracheal deviation present.  Cardiovascular: Normal rate, regular rhythm, normal heart sounds and intact distal pulses.  Exam reveals no gallop and no friction rub.   No murmur heard. Pulmonary/Chest: Effort normal and breath sounds normal. No stridor. No respiratory distress. She has no wheezes. She has no rales. She exhibits no tenderness.  Abdominal: Soft. Bowel sounds are normal. She exhibits no distension and no mass. There is tenderness (very mild, diffuse, lower abdominal tenderness). There is no rebound and no guarding. Hernia confirmed negative in the right inguinal area and confirmed negative in the left inguinal area.  Genitourinary: Uterus normal. Pelvic exam was performed with patient supine. No labial fusion. There is no rash, tenderness, lesion or injury on the right labia. There is no rash, tenderness, lesion or injury on the left labia. Uterus is not deviated, not enlarged, not fixed and not tender. Cervix exhibits no motion tenderness, no discharge and no friability. Right adnexum displays no mass, no tenderness and no fullness. Left adnexum  displays no mass, no tenderness and no fullness. There is bleeding (very small amount of dark blood) around the vagina. No erythema or tenderness around the vagina. No foreign body around the vagina. No signs of injury around the vagina. No vaginal discharge found.       Very mild, generalized discomfort during bimanual exam.  Musculoskeletal: Normal range of motion. She exhibits no edema and no tenderness.  Lymphadenopathy:    She has no cervical adenopathy.       Right: No inguinal adenopathy present.       Left: No inguinal adenopathy present.  Neurological: She is alert and oriented to person, place, and time. No cranial nerve deficit.  Skin: Skin is warm and dry. No rash noted. She is not diaphoretic. No erythema. No pallor.  Psychiatric: She has a normal mood and affect. Her behavior is normal.    ED Course  Procedures (including critical care time)  Labs Reviewed  CBC - Abnormal; Notable for the following:    WBC 10.6 (*)     Hemoglobin 10.5 (*)     HCT 33.8 (*)     MCV 71.5 (*)     MCH 22.2 (*)     RDW 16.9 (*)     All other components within normal limits  URINALYSIS, ROUTINE W REFLEX MICROSCOPIC - Abnormal; Notable for the following:    APPearance CLOUDY (*)     Specific Gravity, Urine 1.035 (*)     Hgb urine dipstick LARGE (*)     Protein, ur 30 (*)     All other components within normal limits  WET PREP, GENITAL - Abnormal; Notable for the following:    WBC, Wet Prep HPF POC FEW (*)     All other components within normal limits  URINE MICROSCOPIC-ADD ON - Abnormal; Notable for the following:    Squamous Epithelial / LPF FEW (*)     All other components within normal limits  BASIC METABOLIC PANEL  PREGNANCY, URINE  GC/CHLAMYDIA PROBE AMP   No results found.   No diagnosis found.    MDM  Pt presents for evaluation of recurrent lowe abdominal pain.  She appears nontoxic, note stable VS, NAD.  She has a history of endometriosis and is currently menstruating.   Her discomfort feels the same as the discomfort she has had previously.  She has no evidence of peritonitis or PID on exam.  She has no specific adnexal tenderness or fullness.  Note mild anemia and otherwise normal labs.  Plan symptomatic care and outpt follow-up with her PMD/gynecologist.        Tobin Chad, MD 08/29/12 [phone]

## 2012-08-30 ENCOUNTER — Inpatient Hospital Stay (HOSPITAL_COMMUNITY): Payer: No Typology Code available for payment source

## 2012-08-30 ENCOUNTER — Encounter (HOSPITAL_COMMUNITY): Payer: Self-pay | Admitting: *Deleted

## 2012-08-30 ENCOUNTER — Inpatient Hospital Stay (HOSPITAL_COMMUNITY)
Admission: AD | Admit: 2012-08-30 | Discharge: 2012-08-30 | Disposition: A | Discharge status: Home / Self Care | Payer: No Typology Code available for payment source | Source: Ambulatory Visit | Attending: Obstetrics and Gynecology | Admitting: Obstetrics and Gynecology

## 2012-08-30 DIAGNOSIS — N83209 Unspecified ovarian cyst, unspecified side: Secondary | ICD-10-CM | POA: Insufficient documentation

## 2012-08-30 DIAGNOSIS — N809 Endometriosis, unspecified: Secondary | ICD-10-CM

## 2012-08-30 DIAGNOSIS — R109 Unspecified abdominal pain: Secondary | ICD-10-CM | POA: Insufficient documentation

## 2012-08-30 DIAGNOSIS — N949 Unspecified condition associated with female genital organs and menstrual cycle: Secondary | ICD-10-CM | POA: Insufficient documentation

## 2012-08-30 DIAGNOSIS — R102 Pelvic and perineal pain: Secondary | ICD-10-CM

## 2012-08-30 HISTORY — DX: Trichomoniasis, unspecified: A59.9

## 2012-08-30 HISTORY — DX: Chlamydial infection, unspecified: A74.9

## 2012-08-30 LAB — POCT PREGNANCY, URINE: Preg Test, Ur: NEGATIVE

## 2012-08-30 MED ORDER — TAPENTADOL HCL 50 MG PO TABS: 50.0000 mg | ORAL_TABLET | ORAL | Status: DC | PRN

## 2012-08-30 MED ORDER — KETOROLAC TROMETHAMINE 60 MG/2ML IM SOLN
60.0000 mg | Freq: Once | INTRAMUSCULAR | Status: AC
  Administered 2012-08-30: 60 mg via INTRAMUSCULAR
  Filled 2012-08-30: qty 2

## 2012-08-30 NOTE — MAU Provider Note (Signed)
S: lower abdominal pain particularly on right side  HPI: 37 yo G0 MBF LMP 1/13 presents for evaluation of severe abdominal pain. Pt was seen at [facility] for same pain and was sent home on percocet. Pt reports pain medication was not working. Pt had dx laparoscopy, fulguration of pelvic endometriosis by Dr. Juliene Pina 02/2012. She was placed on NorQd which patient is still on. Pt notes cycle comes on the pill  12 th of each month. She did not call the office about the pain as she normally could manage and pain would only last 3 days. Pt has hx irreg cycles off pills. She notes this cycle is lasting longer and most painful since surgery. Desires to conceive and was planning to stop OCP this month per plan of Dr Juliene Pina. Pain is located lower abdomen and on right. Denies assoc n/v or urinary sx. Pain is nonradiating Workup from [facility] was reviewed.  PMH all codeine Meds( see list) reviewed in medication reconcilation Medical: PCOS Obesity Pelvic endometriosis  Surg; dx hysteroscopy, dx lap, ablation of pelvic emdometriosis  FH noncontributory SH: married  Nonsmoker ROS see HP  PE: obese black female in no acute distress( given IM toradol) VS: T100.8 -99.2 P86 BP 126/66 Skin: no lesion Lungs clear to A Cor RRR Abd; obese soft nondistended mild tender right active BS throughout Pelvic: vulva (-) lesion Bimanual exam>(+) blood in vault, cervix closed, uterus ant nontender bladder nontender, righjt adnexal fullness min tender Ext: no edema  Sono: nl uterus, sm FF, nl endom, nl left ovary, 3.2 cm complex right hemorrhagic cyst  IMP: Pelvic pain/  Right ovarian cyst Catamenial pain Hx pelvic endometriosis P) change to Nucynta for pain mgmt. F/u w/ Dr Juliene Pina this week. If returns in severe pain need to reassess for intermittent torsion

## 2012-08-30 NOTE — MAU Note (Signed)
Having constant sharp pain in the lower abdomen from center over to the left side. "Feels like the my vagina is retracting when the pain comes" Patient also complains of numbness in the right leg for the last two days. Patient states the pain is unbearable.

## 2012-09-01 ENCOUNTER — Ambulatory Visit (INDEPENDENT_AMBULATORY_CARE_PROVIDER_SITE_OTHER): Payer: No Typology Code available for payment source | Admitting: Internal Medicine

## 2012-09-01 VITALS — BP 109/69 | HR 96 | Temp 98.1°F | Resp 16 | Ht 63.0 in | Wt 170.0 lb

## 2012-09-01 DIAGNOSIS — D509 Iron deficiency anemia, unspecified: Secondary | ICD-10-CM

## 2012-09-01 DIAGNOSIS — N83209 Unspecified ovarian cyst, unspecified side: Secondary | ICD-10-CM

## 2012-09-01 DIAGNOSIS — R102 Pelvic and perineal pain: Secondary | ICD-10-CM

## 2012-09-01 DIAGNOSIS — R300 Dysuria: Secondary | ICD-10-CM

## 2012-09-01 DIAGNOSIS — N809 Endometriosis, unspecified: Secondary | ICD-10-CM

## 2012-09-01 LAB — POCT UA - MICROSCOPIC ONLY
Mucus, UA: NEGATIVE
WBC, Ur, HPF, POC: NEGATIVE
Yeast, UA: NEGATIVE

## 2012-09-01 LAB — POCT URINALYSIS DIPSTICK
Glucose, UA: NEGATIVE
Ketones, UA: 15
Leukocytes, UA: NEGATIVE
Nitrite, UA: NEGATIVE
pH, UA: 5.5

## 2012-09-01 MED ORDER — OXYCODONE-ACETAMINOPHEN 5-325 MG PO TABS: 1.0000 | ORAL_TABLET | Freq: Four times a day (QID) | ORAL | Status: DC | PRN

## 2012-09-01 MED ORDER — MELOXICAM 15 MG PO TABS: 15.0000 mg | ORAL_TABLET | Freq: Every day | ORAL | Status: DC

## 2012-09-01 NOTE — Progress Notes (Signed)
Subjective:    Patient ID: Lori Andrews, female    DOB: Dec 21, 1975, 37 y.o.   MRN: [id number]  HPI here for followup after emergency room visits  08/28/2012 [facility] on emergency room /given pain medications suspecting endometriosis as she's had in the past  Was much worse by 08/30/2012 so went to [facility] ER /ultrasound revealed a hemorrhagic ovarian cyst on the right and some free fluid in the pelvic area /there was a small fibroid Francis Dowse is status post ablation and currently on continuous estrogen to suppress menses  Her pain seemed to coincide with vaginal bleeding /she has 2-3 days of bleeding every month despite continuous estrogen, although it is  Rare that she would have this type of pain associated pain better on meds/can at least walk now/using Percocet which seems better than nucynta  Sl dysuria for 3-4 days, no frequency or urgency--note fever at 2nd er visit Increased gas for several days/appetite OK/no constipation or diarrhea Last intercourse September 2012/no vaginal discharge in the intervening period   Review of Systems No weight loss No fatigue No fever chills night sweats No chest pain or shortness of breath/no flank pain Pain is unrelated to eating    Objective:   Physical Exam BP 109/69  Pulse 96  Temp 98.1 F (36.7 C) (Oral)  Resp 16  Ht 5\' 3"  (1.6 m)  Wt 170 lb (77.111 kg)  BMI 30.11 kg/m2  SpO2 98%  LMP 08/25/2012  In mild pain 3-4/10 abd soft/tender RLQ,RMQ and suprapubic w/out rebound No cva tend    Results for orders placed in visit on 09/01/12  POCT UA - MICROSCOPIC ONLY      Component Value Range   WBC, Ur, HPF, POC neg     RBC, urine, microscopic 0-1     Bacteria, U Microscopic trace     Mucus, UA neg     Epithelial cells, urine per micros 1-0     Crystals, Ur, HPF, POC neg     Casts, Ur, LPF, POC neg     Yeast, UA neg    POCT URINALYSIS DIPSTICK      Component Value Range   Color, UA yellow     Clarity, UA cloudy     Glucose, UA neg     Bilirubin, UA small     Ketones, UA 15     Spec Grav, UA >=1.030     Blood, UA moderate     pH, UA 5.5     Protein, UA 30     Urobilinogen, UA 0.2     Nitrite, UA neg     Leukocytes, UA Negative     Labs in the ER revealed microcytic anemia consistent with her past history/no sickle cell trait    Assessment & Plan:   1. Ovarian cyst   2. Fe deficiency anemia   3. Dysuria   4. Pelvic pain   5. Endometriosis    The plan for now would be to continue pain medications for another week to give this hemorrhagic cyst time to involute Meds ordered this encounter  Medications  . oxyCODONE-acetaminophen (PERCOCET) 5-325 MG per tablet    Sig: Take 1 tablet by mouth every 6 (six) hours as needed for pain.    Dispense:  30 tablet    Refill:  0  . meloxicam (MOBIC) 15 MG tablet    Sig: Take 1 tablet (15 mg total) by mouth daily.    Dispense:  30 tablet    Refill:  0   Hot compresses/hot soaks and so forth She will restart her iron supplements using 325 mg once a day with food to avoid constipation

## 2012-09-04 ENCOUNTER — Telehealth: Payer: Self-pay

## 2012-09-04 NOTE — Telephone Encounter (Signed)
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   Call [phone]  Ext [phone]

## 2012-09-04 NOTE — Telephone Encounter (Signed)
Can she return to work without restrictions? Please advise

## 2012-09-05 NOTE — Telephone Encounter (Signed)
No restrictions//obviously if her pain from her ovarian cyst has not resolved she may not be fully functional but we cannot predict this

## 2012-09-05 NOTE — Telephone Encounter (Signed)
I can not speak to her employer, without patients consent. I have written note for patient stating no restrictions. I have left her a message to call me back.

## 2012-09-05 NOTE — Telephone Encounter (Signed)
I spoke to patient and she is requesting a lifting restriction. I have advised her I can only speak to her ,not her employer. Please advise if patient can have note with lifting restriction.

## 2012-09-05 NOTE — Telephone Encounter (Signed)
Sure--one week 10-15lbs only

## 2012-09-05 NOTE — Telephone Encounter (Signed)
Provided note for her, she is due to follow up with Gyn on Monday Feb 3rd, note provided until this date, per her request faxed for her.

## 2012-09-09 DIAGNOSIS — Z0271 Encounter for disability determination: Secondary | ICD-10-CM

## 2013-03-25 ENCOUNTER — Encounter: Payer: Self-pay | Admitting: Internal Medicine

## 2014-12-20 ENCOUNTER — Encounter (HOSPITAL_COMMUNITY): Payer: Self-pay | Admitting: *Deleted

## 2014-12-20 ENCOUNTER — Inpatient Hospital Stay (HOSPITAL_COMMUNITY)
Admission: AD | Admit: 2014-12-20 | Discharge: 2014-12-21 | Disposition: A | Discharge status: Home / Self Care | Payer: No Typology Code available for payment source | Source: Ambulatory Visit | Attending: Obstetrics and Gynecology | Admitting: Obstetrics and Gynecology

## 2014-12-20 ENCOUNTER — Inpatient Hospital Stay (HOSPITAL_COMMUNITY): Payer: No Typology Code available for payment source

## 2014-12-20 DIAGNOSIS — R109 Unspecified abdominal pain: Secondary | ICD-10-CM | POA: Diagnosis present

## 2014-12-20 DIAGNOSIS — D259 Leiomyoma of uterus, unspecified: Secondary | ICD-10-CM | POA: Diagnosis not present

## 2014-12-20 DIAGNOSIS — Z3202 Encounter for pregnancy test, result negative: Secondary | ICD-10-CM | POA: Diagnosis not present

## 2014-12-20 DIAGNOSIS — N938 Other specified abnormal uterine and vaginal bleeding: Secondary | ICD-10-CM | POA: Diagnosis not present

## 2014-12-20 DIAGNOSIS — N939 Abnormal uterine and vaginal bleeding, unspecified: Secondary | ICD-10-CM

## 2014-12-20 HISTORY — DX: Adverse effect of unspecified anesthetic, initial encounter: T41.45XA

## 2014-12-20 HISTORY — DX: Other complications of anesthesia, initial encounter: T88.59XA

## 2014-12-20 LAB — URINALYSIS, ROUTINE W REFLEX MICROSCOPIC
BILIRUBIN URINE: NEGATIVE
GLUCOSE, UA: NEGATIVE mg/dL
Ketones, ur: NEGATIVE mg/dL
Leukocytes, UA: NEGATIVE
NITRITE: NEGATIVE
Protein, ur: 100 mg/dL — AB
Urobilinogen, UA: 1 mg/dL (ref 0.0–1.0)
pH: 6.5 (ref 5.0–8.0)

## 2014-12-20 LAB — URINE MICROSCOPIC-ADD ON

## 2014-12-20 LAB — CBC
HCT: 30.3 % — ABNORMAL LOW (ref 36.0–46.0)
Hemoglobin: 9.4 g/dL — ABNORMAL LOW (ref 12.0–15.0)
MCH: 21.7 pg — AB (ref 26.0–34.0)
MCHC: 31 g/dL (ref 30.0–36.0)
MCV: 70 fL — ABNORMAL LOW (ref 78.0–100.0)
PLATELETS: 287 10*3/uL (ref 150–400)
RBC: 4.33 MIL/uL (ref 3.87–5.11)
RDW: 19.1 % — ABNORMAL HIGH (ref 11.5–15.5)
WBC: 11.1 10*3/uL — ABNORMAL HIGH (ref 4.0–10.5)

## 2014-12-20 LAB — POCT PREGNANCY, URINE: Preg Test, Ur: NEGATIVE

## 2014-12-20 MED ORDER — CYCLOBENZAPRINE HCL 10 MG PO TABS: 10.0000 mg | ORAL_TABLET | Freq: Two times a day (BID) | ORAL | Status: DC | PRN

## 2014-12-20 MED ORDER — IBUPROFEN 600 MG PO TABS: 600.0000 mg | ORAL_TABLET | Freq: Three times a day (TID) | ORAL | Status: DC | PRN

## 2014-12-20 MED ORDER — BUTALBITAL-APAP-CAFFEINE 50-325-40 MG PO TABS
2.0000 | ORAL_TABLET | Freq: Once | ORAL | Status: AC
  Administered 2014-12-20: 2 via ORAL
  Filled 2014-12-20: qty 2

## 2014-12-20 MED ORDER — IBUPROFEN 800 MG PO TABS: 800.0000 mg | ORAL_TABLET | Freq: Three times a day (TID) | ORAL | Status: DC | PRN

## 2014-12-20 NOTE — MAU Note (Signed)
Abdominal pain x 2 weeks, worse over the last 3-4 days. Vaginal bleeding with blood clots since Friday; vaginal spotting for months before that. Denies vaginal discharge other than the spotting. Urine with foul odor x 2 months; denies dysuria.

## 2014-12-20 NOTE — Discharge Instructions (Signed)
Fibroids Fibroids are lumps (tumors) that can occur any place in a woman's body. These lumps are not cancerous. Fibroids vary in size, weight, and where they grow. HOME CARE  Do not take aspirin.  Write down the number of pads or tampons you use during your period. Tell your doctor. This can help determine the best treatment for you. GET HELP RIGHT AWAY IF:  You have pain in your lower belly (abdomen) that is not helped with medicine.  You have cramps that are not helped with medicine.  You have more bleeding between or during your period.  You feel lightheaded or pass out (faint).  Your lower belly pain gets worse. MAKE SURE YOU:  Understand these instructions.  Will watch your condition.  Will get help right away if you are not doing well or get worse. Document Released: 09/01/2010 Document Revised: 10/22/2011 Document Reviewed: 09/01/2010 [employer] Patient Information 2015 [employer]. This information is not intended to replace advice given to you by your health care provider. Make sure you discuss any questions you have with your health care provider.  

## 2014-12-20 NOTE — MAU Provider Note (Signed)
History    39 yo in with c/o abd pain, vaginal bleeding with clots, Hx of uterine fibroid, and ovarian cyst. Has not had care in 2 years. States she spots all the time but has bled heavy since last Friday with lots of cramping and clots. CSN: [id number]  Arrival date and time: 12/20/14 2143   None     Chief Complaint  Patient presents with  . Abdominal Pain  . Numbness  . Vaginal Bleeding   Vaginal Bleeding The patient's primary symptoms include vaginal bleeding. The current episode started in the past 7 days. The problem occurs constantly. The problem has been unchanged. She is not pregnant. Associated symptoms include abdominal pain. The vaginal bleeding is typical of menses. She has been passing clots (the size of a thumb. ). She has not been passing tissue. Nothing aggravates the symptoms. She has tried nothing for the symptoms. Her past medical history is significant for a gynecological surgery. (Fibroids )    OB History    No data available      Past Medical History  Diagnosis Date  . Heart murmur     occ rapid beats- pt thinks related to caffeine use  . Anemia   . Endometriosis 02/29/2012  . Pelvic pain in female 02/29/2012  . Trichimoniasis     Over 10 years ago  . Chlamydia     Over ten years ago    Past Surgical History  Procedure Laterality Date  . Laparoscopy  02/29/2012    Procedure: LAPAROSCOPY OPERATIVE;  Surgeon: Elveria Royals, MD;  Location: [facility];  Service: Gynecology;  Laterality: N/A;  w/ Chromopertubation  . Dilation and curettage of uterus  02/29/2012    Procedure: DILATATION AND CURETTAGE;  Surgeon: Elveria Royals, MD;  Location: [facility];  Service: Gynecology;  Laterality: N/A;  . Endometrial ablation  02/29/2012    Procedure: ENDOMETRIAL ABLATION;  Surgeon: Elveria Royals, MD;  Location: [facility];  Service: Gynecology;  Laterality: N/A;  . Breast surgery      Lump removed at 39 yo    Family History  Problem Relation Age of Onset  . Diabetes  Mother   . Diabetes Father   . Hyperlipidemia Father   . Diabetes Sister     History  Substance Use Topics  . Smoking status: Never Smoker   . Smokeless tobacco: Not on file  . Alcohol Use: Yes     Comment: Occasionallly     Allergies:  Allergies  Allergen Reactions  . Codeine Itching    Says she can take hydrocodone w/o problem    . Red Dye Itching    Prescriptions prior to admission  Medication Sig Dispense Refill Last Dose  . HYDROcodone-acetaminophen (NORCO/VICODIN) 5-325 MG per tablet Take 1 tablet by mouth every 6 (six) hours as needed for pain (Take 1 - 2 tablets every 4 - 6 hours.). 20 tablet 0 Not Taking  . ibuprofen (ADVIL,MOTRIN) 600 MG tablet Take 1 tablet (600 mg total) by mouth every 8 (eight) hours as needed for pain. 30 tablet 0 Taking  . meloxicam (MOBIC) 15 MG tablet Take 1 tablet (15 mg total) by mouth daily. 30 tablet 0   . Norethindrone (NORA-BE PO) Take 1 tablet by mouth daily.   Taking  . oxyCODONE-acetaminophen (PERCOCET) 5-325 MG per tablet Take 1 tablet by mouth every 6 (six) hours as needed for pain. 30 tablet 0   . PRESCRIPTION MEDICATION Take 1 tablet by mouth daily. Birth Control (  Norec B?)   Taking  . promethazine (PHENERGAN) 25 MG tablet Take 1 tablet (25 mg total) by mouth every 6 (six) hours as needed for nausea. 12 tablet 0 Not Taking  . tapentadol (NUCYNTA) 50 MG TABS Take 1 tablet (50 mg total) by mouth every 4 (four) hours as needed. 15 tablet 0 Taking    Review of Systems  Constitutional: Positive for malaise/fatigue.  HENT: Negative.   Eyes: Negative.   Respiratory: Negative.   Cardiovascular: Negative.   Gastrointestinal: Positive for abdominal pain.  Genitourinary: Positive for vaginal bleeding.  Musculoskeletal: Negative.   Skin: Negative.   Neurological: Negative.   Endo/Heme/Allergies: Negative.   Psychiatric/Behavioral: Negative.    Physical Exam   Blood pressure 144/83, pulse 80, temperature 98.6 F (37 C),  temperature source Oral, resp. rate 16, height 5' 3.5" (1.613 m), weight 180 lb 3.2 oz (81.738 kg), last menstrual period 12/17/2014, SpO2 100 %.  Physical Exam  Constitutional: She appears well-developed and well-nourished.  HENT:  Head: Normocephalic.  Eyes: Pupils are equal, round, and reactive to light.  Neck: Normal range of motion.  Cardiovascular: Normal rate, regular rhythm, normal heart sounds and intact distal pulses.   Respiratory: Effort normal and breath sounds normal.  GI: Soft. Bowel sounds are normal.  Genitourinary:  Sterile spec exam done pt is having sm amt bright bleeding with clots. bimanuel exam done but uterus difficult to assess due to body habitis   Results for orders placed or performed during the hospital encounter of 12/20/14 (from the past 24 hour(s))  Urinalysis, Routine w reflex microscopic     Status: Abnormal   Collection Time: 12/20/14  9:55 PM  Result Value Ref Range   Color, Urine RED (A) YELLOW   APPearance CLOUDY (A) CLEAR   Specific Gravity, Urine >1.030 (H) 1.005 - 1.030   pH 6.5 5.0 - 8.0   Glucose, UA NEGATIVE NEGATIVE mg/dL   Hgb urine dipstick LARGE (A) NEGATIVE   Bilirubin Urine NEGATIVE NEGATIVE   Ketones, ur NEGATIVE NEGATIVE mg/dL   Protein, ur 100 (A) NEGATIVE mg/dL   Urobilinogen, UA 1.0 0.0 - 1.0 mg/dL   Nitrite NEGATIVE NEGATIVE   Leukocytes, UA NEGATIVE NEGATIVE  Urine microscopic-add on     Status: Abnormal   Collection Time: 12/20/14  9:55 PM  Result Value Ref Range   Squamous Epithelial / LPF FEW (A) RARE   WBC, UA 3-6 <3 WBC/hpf   RBC / HPF TOO NUMEROUS TO COUNT <3 RBC/hpf   Bacteria, UA RARE RARE   Urine-Other URINALYSIS PERFORMED ON SUPERNATANT   Pregnancy, urine POC     Status: None   Collection Time: 12/20/14 10:12 PM  Result Value Ref Range   Preg Test, Ur NEGATIVE NEGATIVE  CBC     Status: Abnormal   Collection Time: 12/20/14 10:21 PM  Result Value Ref Range   WBC 11.1 (H) 4.0 - 10.5 K/uL   RBC 4.33 3.87 -  5.11 MIL/uL   Hemoglobin 9.4 (L) 12.0 - 15.0 g/dL   HCT 30.3 (L) 36.0 - 46.0 %   MCV 70.0 (L) 78.0 - 100.0 fL   MCH 21.7 (L) 26.0 - 34.0 pg   MCHC 31.0 30.0 - 36.0 g/dL   RDW 19.1 (H) 11.5 - 15.5 %   Platelets 287 150 - 400 K/uL   US Transvaginal Non-ob  12/20/2014   CLINICAL DATA:  Heavy vaginal bleeding since Friday.  EXAM: TRANSABDOMINAL AND TRANSVAGINAL ULTRASOUND OF PELVIS  TECHNIQUE: Both transabdominal and transvaginal  ultrasound examinations of the pelvis were performed. Transabdominal technique was performed for global imaging of the pelvis including uterus, ovaries, adnexal regions, and pelvic cul-de-sac. It was necessary to proceed with endovaginal exam following the transabdominal exam to visualize the ovaries.  COMPARISON:  08/30/2012, 08/15/2010  FINDINGS: Uterus  Measurements: 8.6 x 4.5 x 6.4 cm. There is a right fundal fibroid measuring 2.2 cm. There is a left fundal fibroid measuring 2.5 cm. These are not significantly different from 08/15/2010.  Endometrium  Thickness: 8 mm.  No focal abnormality visualized.  Right ovary  Measurements: 3.0 x 2.6 x 2.5 cm. Normal appearance/no adnexal mass.  Left ovary  Measurements: 3.3 x 2.3 x 2.1 cm. Normal appearance/no adnexal mass.  Other findings  No free fluid.  IMPRESSION: Uterine fibroids measuring up to 2.5 cm. Normal ovaries. Unremarkable endometrium, 8 mm.   Electronically Signed   By: Andreas Newport M.D.   On: 12/20/2014 23:17   US Pelvis Complete  12/20/2014   CLINICAL DATA:  Heavy vaginal bleeding since Friday.  EXAM: TRANSABDOMINAL AND TRANSVAGINAL ULTRASOUND OF PELVIS  TECHNIQUE: Both transabdominal and transvaginal ultrasound examinations of the pelvis were performed. Transabdominal technique was performed for global imaging of the pelvis including uterus, ovaries, adnexal regions, and pelvic cul-de-sac. It was necessary to proceed with endovaginal exam following the transabdominal exam to visualize the ovaries.  COMPARISON:   08/30/2012, 08/15/2010  FINDINGS: Uterus  Measurements: 8.6 x 4.5 x 6.4 cm. There is a right fundal fibroid measuring 2.2 cm. There is a left fundal fibroid measuring 2.5 cm. These are not significantly different from 08/15/2010.  Endometrium  Thickness: 8 mm.  No focal abnormality visualized.  Right ovary  Measurements: 3.0 x 2.6 x 2.5 cm. Normal appearance/no adnexal mass.  Left ovary  Measurements: 3.3 x 2.3 x 2.1 cm. Normal appearance/no adnexal mass.  Other findings  No free fluid.  IMPRESSION: Uterine fibroids measuring up to 2.5 cm. Normal ovaries. Unremarkable endometrium, 8 mm.   Electronically Signed   By: Andreas Newport M.D.   On: 12/20/2014 23:17     MAU Course  Procedures  MDM DUB   Assessment and Plan  CBC with dif, pelvic u/s.  1. Episode of heavy vaginal bleeding   2. Dysfunctional uterine bleeding   3. Uterine leiomyoma, unspecified location    DC home RX: flexeril Ibuprofen 800 mg PRN  Return to MAU as needed FU with the clinic  Follow-up Information    Follow up with [facility].   Specialty:  Obstetrics and Gynecology   Why:  they will call you with an appointment    Contact information:   [place] [phone]     Mathis Bud 11:41 PM 12/20/2014   LAWSON, MARIE DARLENE 12/20/2014, 10:04 PM

## 2014-12-21 DIAGNOSIS — N938 Other specified abnormal uterine and vaginal bleeding: Secondary | ICD-10-CM | POA: Diagnosis not present

## 2015-01-12 ENCOUNTER — Ambulatory Visit (INDEPENDENT_AMBULATORY_CARE_PROVIDER_SITE_OTHER): Payer: 59 | Admitting: Obstetrics and Gynecology

## 2015-01-12 ENCOUNTER — Encounter: Payer: Self-pay | Admitting: Obstetrics and Gynecology

## 2015-01-12 VITALS — BP 137/83 | HR 92 | Temp 98.4°F | Wt 182.3 lb

## 2015-01-12 DIAGNOSIS — Z3202 Encounter for pregnancy test, result negative: Secondary | ICD-10-CM

## 2015-01-12 DIAGNOSIS — R102 Pelvic and perineal pain: Secondary | ICD-10-CM

## 2015-01-12 DIAGNOSIS — Z01812 Encounter for preprocedural laboratory examination: Secondary | ICD-10-CM

## 2015-01-12 DIAGNOSIS — N809 Endometriosis, unspecified: Secondary | ICD-10-CM

## 2015-01-12 LAB — POCT PREGNANCY, URINE: PREG TEST UR: NEGATIVE

## 2015-01-12 MED ORDER — MEDROXYPROGESTERONE ACETATE 150 MG/ML IM SUSP
150.0000 mg | Freq: Once | INTRAMUSCULAR | Status: AC
  Administered 2015-01-12: 150 mg via INTRAMUSCULAR

## 2015-01-12 NOTE — Progress Notes (Signed)
Patient ID: Lori Andrews, female   DOB: 02/09/1976, 39 y.o.   MRN: [id number] 39 yo G0 with LMP 12/17/2014 presenting today for the evaluation of irregular cycles and abdominal pain. Her pain is localized in the left lower quadrant. It has been present for the past 3-4 years. She describes it as a Abraham Margulies pain although it is not present everyday. There are not aggravating factors and her pain is alleviated by ibuprofen and heating pad. She reports constipation but is not certain if her pain improves following a bowel movement. As far as her cycles are concerns, she has a long standing history of irregular menstrual cycles, often skipping months. She recently went an entire year with amenorrhea until 12/17/2014. She had a laparoscopy in 2013 with normal findings and with the absence of endometriosis.  Past Medical History  Diagnosis Date  . Heart murmur     occ rapid beats- pt thinks related to caffeine use  . Anemia   . Endometriosis 02/29/2012  . Pelvic pain in female 02/29/2012  . Trichimoniasis     Over 10 years ago  . Chlamydia     Over ten years ago  . Complication of anesthesia     Difficulty waking up from anesthetic   \ Past Surgical History  Procedure Laterality Date  . Laparoscopy  02/29/2012    Procedure: LAPAROSCOPY OPERATIVE;  Surgeon: Elveria Royals, MD;  Location: [facility];  Service: Gynecology;  Laterality: N/A;  w/ Chromopertubation  . Dilation and curettage of uterus  02/29/2012    Procedure: DILATATION AND CURETTAGE;  Surgeon: Elveria Royals, MD;  Location: [facility];  Service: Gynecology;  Laterality: N/A;  . Endometrial ablation  02/29/2012    Procedure: ENDOMETRIAL ABLATION;  Surgeon: Elveria Royals, MD;  Location: [facility];  Service: Gynecology;  Laterality: N/A;  . Breast surgery      Lump removed at 39 yo   Family History  Problem Relation Age of Onset  . Diabetes Mother   . Diabetes Father   . Hyperlipidemia Father   . Diabetes Sister    History  Substance Use  Topics  . Smoking status: Never Smoker   . Smokeless tobacco: Never Used  . Alcohol Use: Yes     Comment: Occasionallly    ROS See pertinent in HPI  GENERAL: Well-developed, well-nourished female in no acute distress. Obese ABDOMEN: Soft, nontender, nondistended. No organomegaly. PELVIC: Normal external female genitalia. Vagina is pink and rugated.  Normal discharge. Normal appearing cervix. Uterus is normal in size. No adnexal mass or tenderness. EXTREMITIES: No cyanosis, clubbing, or edema, 2+ distal pulses.   Ultrasound 12/20/2014 FINDINGS: Uterus  Measurements: 8.6 x 4.5 x 6.4 cm. There is a right fundal fibroid measuring 2.2 cm. There is a left fundal fibroid measuring 2.5 cm. These are not significantly different from 08/15/2010.  Endometrium  Thickness: 8 mm. No focal abnormality visualized.  Right ovary  Measurements: 3.0 x 2.6 x 2.5 cm. Normal appearance/no adnexal mass.  Left ovary  Measurements: 3.3 x 2.3 x 2.1 cm. Normal appearance/no adnexal mass.  Other findings  No free fluid.  IMPRESSION: Uterine fibroids measuring up to 2.5 cm. Normal ovaries. Unremarkable endometrium, 8 mm.  A/P 39 yo with chronic LLQ pain and irregular cycles - Discussed normal ultrasound findings and no clear GYN etiology for her pain. Advised to follow up with PCP - Discussed need to regulate cycles with contraception in order to decrease risks of endometrial cancer. Birth control options reviewed with  the patient and she opted for depo-provera - patient will return in 6 months for new contraception if she is unhappy with her current method

## 2015-02-15 ENCOUNTER — Inpatient Hospital Stay (HOSPITAL_COMMUNITY)
Admission: AD | Admit: 2015-02-15 | Discharge: 2015-02-15 | Disposition: A | Discharge status: Home / Self Care | Payer: Self-pay | Source: Ambulatory Visit | Attending: Family Medicine | Admitting: Family Medicine

## 2015-02-15 ENCOUNTER — Inpatient Hospital Stay (HOSPITAL_COMMUNITY): Payer: Self-pay

## 2015-02-15 ENCOUNTER — Encounter (HOSPITAL_COMMUNITY): Payer: Self-pay | Admitting: *Deleted

## 2015-02-15 ENCOUNTER — Inpatient Hospital Stay (HOSPITAL_COMMUNITY): Payer: 59

## 2015-02-15 DIAGNOSIS — R109 Unspecified abdominal pain: Secondary | ICD-10-CM | POA: Insufficient documentation

## 2015-02-15 DIAGNOSIS — D252 Subserosal leiomyoma of uterus: Secondary | ICD-10-CM | POA: Insufficient documentation

## 2015-02-15 DIAGNOSIS — R102 Pelvic and perineal pain: Secondary | ICD-10-CM | POA: Insufficient documentation

## 2015-02-15 LAB — COMPREHENSIVE METABOLIC PANEL
ALT: 16 U/L (ref 14–54)
ANION GAP: 3 — AB (ref 5–15)
AST: 15 U/L (ref 15–41)
Albumin: 3.5 g/dL (ref 3.5–5.0)
Alkaline Phosphatase: 92 U/L (ref 38–126)
BILIRUBIN TOTAL: 0.3 mg/dL (ref 0.3–1.2)
BUN: 14 mg/dL (ref 6–20)
CALCIUM: 8.4 mg/dL — AB (ref 8.9–10.3)
CO2: 22 mmol/L (ref 22–32)
Chloride: 111 mmol/L (ref 101–111)
Creatinine, Ser: 0.99 mg/dL (ref 0.44–1.00)
GFR calc Af Amer: >60 mL/min (ref >60)
GFR calc non Af Amer: >60 mL/min (ref >60)
GLUCOSE: 108 mg/dL — AB (ref 65–99)
POTASSIUM: 3.6 mmol/L (ref 3.5–5.1)
SODIUM: 136 mmol/L (ref 135–145)
Total Protein: 7.4 g/dL (ref 6.5–8.1)

## 2015-02-15 LAB — URINALYSIS, ROUTINE W REFLEX MICROSCOPIC
Bilirubin Urine: NEGATIVE
Glucose, UA: NEGATIVE mg/dL
Ketones, ur: NEGATIVE mg/dL
LEUKOCYTES UA: NEGATIVE
Nitrite: NEGATIVE
Protein, ur: NEGATIVE mg/dL
Specific Gravity, Urine: >1.03 — ABNORMAL HIGH (ref 1.005–1.030)
Urobilinogen, UA: 1 mg/dL (ref 0.0–1.0)
pH: 5.5 (ref 5.0–8.0)

## 2015-02-15 LAB — CBC
HEMATOCRIT: 32.6 % — AB (ref 36.0–46.0)
Hemoglobin: 9.8 g/dL — ABNORMAL LOW (ref 12.0–15.0)
MCH: 20.6 pg — AB (ref 26.0–34.0)
MCHC: 30.1 g/dL (ref 30.0–36.0)
MCV: 68.5 fL — ABNORMAL LOW (ref 78.0–100.0)
Platelets: 313 10*3/uL (ref 150–400)
RBC: 4.76 MIL/uL (ref 3.87–5.11)
RDW: 18.1 % — ABNORMAL HIGH (ref 11.5–15.5)
WBC: 13.4 10*3/uL — AB (ref 4.0–10.5)

## 2015-02-15 LAB — POCT PREGNANCY, URINE: Preg Test, Ur: NEGATIVE

## 2015-02-15 LAB — URINE MICROSCOPIC-ADD ON

## 2015-02-15 LAB — WET PREP, GENITAL
Clue Cells Wet Prep HPF POC: NONE SEEN
TRICH WET PREP: NONE SEEN
YEAST WET PREP: NONE SEEN

## 2015-02-15 LAB — HIV ANTIBODY (ROUTINE TESTING W REFLEX): HIV Screen 4th Generation wRfx: NONREACTIVE

## 2015-02-15 MED ORDER — IOHEXOL 300 MG/ML  SOLN
50.0000 mL | INTRAMUSCULAR | Status: AC
  Administered 2015-02-15: 50 mL via ORAL

## 2015-02-15 MED ORDER — HYDROCODONE-ACETAMINOPHEN 5-325 MG PO TABS
2.0000 | ORAL_TABLET | Freq: Once | ORAL | Status: AC
  Administered 2015-02-15: 2 via ORAL
  Filled 2015-02-15: qty 2

## 2015-02-15 MED ORDER — IOHEXOL 300 MG/ML  SOLN
100.0000 mL | Freq: Once | INTRAMUSCULAR | Status: AC | PRN
  Administered 2015-02-15: 100 mL via INTRAVENOUS

## 2015-02-15 MED ORDER — HYDROCODONE-ACETAMINOPHEN 5-325 MG PO TABS: 1.0000 | ORAL_TABLET | ORAL | Status: DC | PRN

## 2015-02-15 MED ORDER — LACTATED RINGERS IV SOLN
INTRAVENOUS | Status: DC
  Administered 2015-02-15 (×2): via INTRAVENOUS

## 2015-02-15 MED ORDER — KETOROLAC TROMETHAMINE 60 MG/2ML IM SOLN
60.0000 mg | Freq: Once | INTRAMUSCULAR | Status: AC
  Administered 2015-02-15: 60 mg via INTRAMUSCULAR
  Filled 2015-02-15: qty 2

## 2015-02-15 NOTE — MAU Provider Note (Signed)
History     CSN: [id number]  Arrival date and time: 02/15/15 0729   None     Chief Complaint  Patient presents with  . Pelvic Pain  . Chills   HPI   Ms. Lori Andrews is a 39 y.o. female G0P0000 with a history of chronic abdominal pain and constipation presenting to MAU with pelvic pain and chills.  She was seen on June 1st by Dr. Elly Modena for abdominal pain and received depo provera for dysmenorria. She states that in the last few days her pain has intensified; worse in the middle-right left lower abdomen.  The pain is described as intense cramping, and sharp, shooting pains here and there.  She currently rates her pain 8/10  Patient took 2 alieve this morning at 0600; this did not relieve the pain at all. The pain has come unbearable and this is why she came in.   OB History    Gravida Para Term Preterm AB TAB SAB Ectopic Multiple Living   0 0 0 0 0 0 0 0 0 0       Past Medical History  Diagnosis Date  . Heart murmur     occ rapid beats- pt thinks related to caffeine use  . Anemia   . Endometriosis 02/29/2012  . Pelvic pain in female 02/29/2012  . Trichimoniasis     Over 10 years ago  . Chlamydia     Over ten years ago  . Complication of anesthesia     Difficulty waking up from anesthetic    Past Surgical History  Procedure Laterality Date  . Laparoscopy  02/29/2012    Procedure: LAPAROSCOPY OPERATIVE;  Surgeon: Elveria Royals, MD;  Location: [facility];  Service: Gynecology;  Laterality: N/A;  w/ Chromopertubation  . Dilation and curettage of uterus  02/29/2012    Procedure: DILATATION AND CURETTAGE;  Surgeon: Elveria Royals, MD;  Location: [facility];  Service: Gynecology;  Laterality: N/A;  . Endometrial ablation  02/29/2012    Procedure: ENDOMETRIAL ABLATION;  Surgeon: Elveria Royals, MD;  Location: [facility];  Service: Gynecology;  Laterality: N/A;  . Breast surgery      Lump removed at 39 yo    Family History  Problem Relation Age of Onset  . Diabetes Mother   .  Diabetes Father   . Hyperlipidemia Father   . Diabetes Sister     History  Substance Use Topics  . Smoking status: Never Smoker   . Smokeless tobacco: Never Used  . Alcohol Use: Yes     Comment: Occasionallly     Allergies:  Allergies  Allergen Reactions  . Codeine Itching    Says she can take hydrocodone w/o problem    . Red Dye Itching    Prescriptions prior to admission  Medication Sig Dispense Refill Last Dose  . Cyanocobalamin (B-12 PO) Take 1 tablet by mouth once a week.   Past Month at Unknown time  . cyclobenzaprine (FLEXERIL) 10 MG tablet Take 1 tablet (10 mg total) by mouth 2 (two) times daily as needed for muscle spasms. 20 tablet 0 Taking  . ibuprofen (ADVIL,MOTRIN) 200 MG tablet Take 400 mg by mouth every 6 (six) hours as needed for moderate pain.   Taking  . ibuprofen (ADVIL,MOTRIN) 600 MG tablet Take 1 tablet (600 mg total) by mouth every 8 (eight) hours as needed. 30 tablet 0 Taking  . IRON PO Take 1 tablet by mouth as needed (low iron).   Past Month at  Unknown time   Results for orders placed or performed during the hospital encounter of 02/15/15 (from the past 48 hour(s))  Urinalysis, Routine w reflex microscopic (not at [facility])     Status: Abnormal   Collection Time: 02/15/15  7:48 AM  Result Value Ref Range   Color, Urine YELLOW YELLOW   APPearance CLEAR CLEAR   Specific Gravity, Urine >1.030 (H) 1.005 - 1.030   pH 5.5 5.0 - 8.0   Glucose, UA NEGATIVE NEGATIVE mg/dL   Hgb urine dipstick MODERATE (A) NEGATIVE   Bilirubin Urine NEGATIVE NEGATIVE   Ketones, ur NEGATIVE NEGATIVE mg/dL   Protein, ur NEGATIVE NEGATIVE mg/dL   Urobilinogen, UA 1.0 0.0 - 1.0 mg/dL   Nitrite NEGATIVE NEGATIVE   Leukocytes, UA NEGATIVE NEGATIVE  Urine microscopic-add on     Status: None   Collection Time: 02/15/15  7:48 AM  Result Value Ref Range   Squamous Epithelial / LPF RARE RARE   WBC, UA 0-2 <3 WBC/hpf   RBC / HPF 0-2 <3 RBC/hpf   Bacteria, UA RARE RARE  Pregnancy,  urine POC     Status: None   Collection Time: 02/15/15  8:23 AM  Result Value Ref Range   Preg Test, Ur NEGATIVE NEGATIVE    Comment:        THE SENSITIVITY OF THIS METHODOLOGY IS >24 mIU/mL   HIV antibody     Status: None   Collection Time: 02/15/15  8:31 AM  Result Value Ref Range   HIV Screen 4th Generation wRfx Non Reactive Non Reactive    Comment: (NOTE) Performed At: [facility] [place], [place] [id number] Lindon Romp MD RF:[id number]   CBC     Status: Abnormal   Collection Time: 02/15/15  8:31 AM  Result Value Ref Range   WBC 13.4 (H) 4.0 - 10.5 K/uL   RBC 4.76 3.87 - 5.11 MIL/uL   Hemoglobin 9.8 (L) 12.0 - 15.0 g/dL   HCT 32.6 (L) 36.0 - 46.0 %   MCV 68.5 (L) 78.0 - 100.0 fL   MCH 20.6 (L) 26.0 - 34.0 pg   MCHC 30.1 30.0 - 36.0 g/dL   RDW 18.1 (H) 11.5 - 15.5 %   Platelets 313 150 - 400 K/uL  Comprehensive metabolic panel     Status: Abnormal   Collection Time: 02/15/15  8:31 AM  Result Value Ref Range   Sodium 136 135 - 145 mmol/L   Potassium 3.6 3.5 - 5.1 mmol/L   Chloride 111 101 - 111 mmol/L   CO2 22 22 - 32 mmol/L   Glucose, Bld 108 (H) 65 - 99 mg/dL   BUN 14 6 - 20 mg/dL   Creatinine, Ser 0.99 0.44 - 1.00 mg/dL   Calcium 8.4 (L) 8.9 - 10.3 mg/dL   Total Protein 7.4 6.5 - 8.1 g/dL   Albumin 3.5 3.5 - 5.0 g/dL   AST 15 15 - 41 U/L   ALT 16 14 - 54 U/L   Alkaline Phosphatase 92 38 - 126 U/L   Total Bilirubin 0.3 0.3 - 1.2 mg/dL   GFR calc non Af Amer >60 >60 mL/min   GFR calc Af Amer >60 >60 mL/min    Comment: (NOTE) The eGFR has been calculated using the CKD EPI equation. This calculation has not been validated in all clinical situations. eGFR's persistently <60 mL/min signify possible Chronic Kidney Disease.    Anion gap 3 (L) 5 - 15  Wet prep, genital  Status: Abnormal   Collection Time: 02/15/15  9:25 AM  Result Value Ref Range   Yeast Wet Prep HPF POC NONE SEEN NONE SEEN   Trich, Wet Prep NONE SEEN NONE SEEN    Clue Cells Wet Prep HPF POC NONE SEEN NONE SEEN   WBC, Wet Prep HPF POC FEW (A) NONE SEEN    Comment: FEW BACTERIA SEEN    US Transvaginal Non-ob  02/15/2015   CLINICAL DATA:  Right side pelvic pain. Chills. Prior endometrial ablation and D and C.  EXAM: TRANSABDOMINAL AND TRANSVAGINAL ULTRASOUND OF PELVIS  TECHNIQUE: Both transabdominal and transvaginal ultrasound examinations of the pelvis were performed. Transabdominal technique was performed for global imaging of the pelvis including uterus, ovaries, adnexal regions, and pelvic cul-de-sac. It was necessary to proceed with endovaginal exam following the transabdominal exam to visualize the uterus, endometrium, ovaries and adnexa .  COMPARISON:  12/20/2014  FINDINGS: Uterus  Measurements: 9.1 x 5.0 x 6.0 cm. Two uterine fibroids including a fundal right fundal subserosal fibroid measuring up to 3.3 cm and a left intramural fibroid in the fundus measuring up to 1.8 cm.  Endometrium  Thickness: 12 mm in thickness.  No focal abnormality visualized.  Right ovary  Measurements: 2.9 x 1.4 x 2.4 cm. Normal appearance/no adnexal mass.  Left ovary  Measurements: 1.8 x 2.2 x 2.1 cm. Normal appearance/no adnexal mass.  Other findings  No free fluid.  IMPRESSION: Fibroid uterus as above.  No acute findings.   Electronically Signed   By: Rolm Baptise M.D.   On: 02/15/2015 10:39   Ct Abdomen Pelvis W Contrast  02/15/2015   CLINICAL DATA:  Abdominal pain and chills for 3 weeks  EXAM: CT ABDOMEN AND PELVIS WITH CONTRAST  TECHNIQUE: Multidetector CT imaging of the abdomen and pelvis was performed using the standard protocol following bolus administration of intravenous contrast.  CONTRAST:  111m OMNIPAQUE IOHEXOL 300 MG/ML  SOLN  COMPARISON:  None.  FINDINGS: Lower chest: The lung bases appear clear. No pleural or pericardial effusion.  Hepatobiliary: There is no suspicious liver abnormality. The gallbladder appears normal. No biliary dilatation.  Pancreas: Unremarkable  appearance of the pancreas.  Spleen:  Negative.  Adrenals/Urinary Tract: The adrenal glands are negative. The kidneys are on unremarkable. Urinary bladder appears normal.  Stomach/Bowel: The stomach is within normal limits. The small bowel loops have a normal course and caliber. No obstruction. Normal appearance of the colon. The appendix is visualized and appears normal. Normal appearance of the colon.  Vascular/Lymphatic: No enlarged retroperitoneal or mesenteric adenopathy. No enlarged pelvic or inguinal lymph nodes. Normal appearance of the abdominal aorta.  Reproductive: Degenerating, subserosal fibroid is identified within the right side of the uterus measuring 2.8 cm. The ovaries appear normal.  Other: There is no ascites or focal fluid collections within the abdomen or pelvis.  Musculoskeletal: Unremarkable.  IMPRESSION: 1. No acute findings identified within the abdomen or pelvis. 2. Degenerating, subserosal fibroid is identified within the right side of the uterus.   Electronically Signed   By: TKerby MoorsM.D.   On: 02/15/2015 13:52    Review of Systems  Constitutional: Positive for chills.  Gastrointestinal: Positive for abdominal pain and constipation (Last BM was 2 days ago). Negative for nausea, vomiting and diarrhea.  Genitourinary: Positive for dysuria, urgency and frequency.       Vaginal spotting currently. Has not had a regular period since May.   Musculoskeletal: Positive for back pain.   Physical Exam  Blood pressure 128/83, pulse 99, temperature 98.8 F (37.1 C), temperature source Oral, resp. rate 20.  Physical Exam  Constitutional: She is oriented to person, place, and time. She appears well-developed and well-nourished. No distress.  HENT:  Head: Normocephalic.  Eyes: Pupils are equal, round, and reactive to light.  Neck: Neck supple.  GI: Soft. Normal appearance. There is tenderness in the suprapubic area and left lower quadrant. There is guarding. There is no  rigidity and no rebound.  Genitourinary:  Speculum exam: Vagina - Small amount of dark red blood in the vaginal canal.  Cervix -no active bleeding  Bimanual exam: Cervix closed Uterus non tender, normal size Adnexa non tender, no masses bilaterally Wet prep and GC collected  Chaperone present for exam.  Musculoskeletal: Normal range of motion.  Neurological: She is alert and oriented to person, place, and time.  Skin: Skin is warm. She is not diaphoretic.  Psychiatric: Her behavior is normal.    MAU Course  Procedures  None  MDM  Discussed patient with Dr. Nehemiah Settle; Will proceed with CT scan. Pain is unlikely GYN related; or related to uterine fibroids.   Pain currently 0/10 following vicodin; however patient continues to have sharp, shooting pain occasionally in her pelvis.   Assessment and Plan   A:  1. Subserosal leiomyoma of uterus ; degenerating   2. Pelvic pain in female   3. Abdominal pain    P:  Discharge home in stable condition RX: Vicodin  Follow up with [facility]; Dr. Elly Modena. Call to make an appointment.  Return to MAU if symptoms worsen  Ok to take ibuprofen as directed on the bottle; alternate with Vicodin.   Lezlie Lye, NP 02/15/2015 8:25 AM

## 2015-02-15 NOTE — MAU Note (Signed)
C/o pelvic pain for past 2 days; c/o abdominal pain and chills for past 3 weeks; symptoms started when pt got the Depo; c/o numbness in R arm and both legs for a week;

## 2015-02-16 LAB — GC/CHLAMYDIA PROBE AMP (~~LOC~~) NOT AT ARMC
Chlamydia: NEGATIVE
Neisseria Gonorrhea: NEGATIVE

## 2015-03-30 ENCOUNTER — Ambulatory Visit: Payer: 59

## 2015-04-13 ENCOUNTER — Ambulatory Visit: Payer: 59 | Admitting: Obstetrics and Gynecology

## 2015-07-11 ENCOUNTER — Encounter: Payer: Self-pay | Admitting: Internal Medicine

## 2015-07-26 ENCOUNTER — Inpatient Hospital Stay (HOSPITAL_COMMUNITY)
Admission: AD | Admit: 2015-07-26 | Discharge: 2015-07-26 | Disposition: A | Discharge status: Home / Self Care | Payer: Self-pay | Source: Ambulatory Visit | Attending: Family Medicine | Admitting: Family Medicine

## 2015-07-26 ENCOUNTER — Encounter (HOSPITAL_COMMUNITY): Payer: Self-pay | Admitting: *Deleted

## 2015-07-26 DIAGNOSIS — R1031 Right lower quadrant pain: Secondary | ICD-10-CM | POA: Insufficient documentation

## 2015-07-26 DIAGNOSIS — R102 Pelvic and perineal pain: Secondary | ICD-10-CM | POA: Insufficient documentation

## 2015-07-26 DIAGNOSIS — K5900 Constipation, unspecified: Secondary | ICD-10-CM | POA: Insufficient documentation

## 2015-07-26 DIAGNOSIS — D252 Subserosal leiomyoma of uterus: Secondary | ICD-10-CM

## 2015-07-26 DIAGNOSIS — D259 Leiomyoma of uterus, unspecified: Secondary | ICD-10-CM | POA: Insufficient documentation

## 2015-07-26 LAB — CBC WITH DIFFERENTIAL/PLATELET
Basophils Absolute: 0.1 10*3/uL (ref 0.0–0.1)
Basophils Relative: 1 %
EOS ABS: 0.2 10*3/uL (ref 0.0–0.7)
Eosinophils Relative: 2 %
HCT: 31.3 % — ABNORMAL LOW (ref 36.0–46.0)
Hemoglobin: 9.1 g/dL — ABNORMAL LOW (ref 12.0–15.0)
LYMPHS ABS: 3.5 10*3/uL (ref 0.7–4.0)
LYMPHS PCT: 34 %
MCH: 19 pg — AB (ref 26.0–34.0)
MCHC: 29.1 g/dL — ABNORMAL LOW (ref 30.0–36.0)
MCV: 65.3 fL — ABNORMAL LOW (ref 78.0–100.0)
Monocytes Absolute: 0.8 10*3/uL (ref 0.1–1.0)
Monocytes Relative: 8 %
NEUTROS PCT: 55 %
Neutro Abs: 5.7 10*3/uL (ref 1.7–7.7)
PLATELETS: 310 10*3/uL (ref 150–400)
RBC: 4.79 MIL/uL (ref 3.87–5.11)
RDW: 20 % — ABNORMAL HIGH (ref 11.5–15.5)
WBC: 10.4 10*3/uL (ref 4.0–10.5)

## 2015-07-26 LAB — URINALYSIS, ROUTINE W REFLEX MICROSCOPIC
BILIRUBIN URINE: NEGATIVE
GLUCOSE, UA: NEGATIVE mg/dL
HGB URINE DIPSTICK: NEGATIVE
Ketones, ur: NEGATIVE mg/dL
Leukocytes, UA: NEGATIVE
Nitrite: NEGATIVE
PH: 6 (ref 5.0–8.0)
Protein, ur: NEGATIVE mg/dL

## 2015-07-26 LAB — WET PREP, GENITAL
CLUE CELLS WET PREP: NONE SEEN
Sperm: NONE SEEN
Trich, Wet Prep: NONE SEEN
YEAST WET PREP: NONE SEEN

## 2015-07-26 MED ORDER — IBUPROFEN 800 MG PO TABS: 800.0000 mg | ORAL_TABLET | Freq: Three times a day (TID) | ORAL | Status: DC

## 2015-07-26 MED ORDER — HYDROCODONE-ACETAMINOPHEN 5-325 MG PO TABS: 1.0000 | ORAL_TABLET | ORAL | Status: DC | PRN

## 2015-07-26 NOTE — MAU Note (Addendum)
PT SAYS SHE STARTED HAVING PAIN  ABOVE LEFT GROIN  AFTER CYCLE   THEN  Sunday  SHE  STARTED  HAVING  SAME  PAIN ON RIGHT  GROIN .       TAKES  IBUPROFEN-  LAST  AT 530PM  800MG  -     RELIEVES  PAIN  BUT  PAIN COMES  BACK .     SAYS  SHE  WAS  HERE  4-5  MTHS  AGO- FOR  SAME   C/O.    PAIN NOW     IS  ABOUT  THE  SAME .    NO FAMILY  DR.            Roney Jaffe  AT [facility]   MAYBE  DIVERTICULITIS.     UNABLE   TO GET  URINE    LAST  SEX-   SUNDAY

## 2015-07-26 NOTE — Discharge Instructions (Signed)
Dysfunctional Uterine Bleeding Dysfunctional uterine bleeding is abnormal bleeding from the uterus. Dysfunctional uterine bleeding includes:  A period that comes earlier or later than usual.  A period that is lighter, heavier, or has blood clots.  Bleeding between periods.  Skipping one or more periods.  Bleeding after sexual intercourse.  Bleeding after menopause. HOME CARE INSTRUCTIONS  Pay attention to any changes in your symptoms. Follow these instructions to help with your condition: Eating  Eat well-balanced meals. Include foods that are high in iron, such as liver, meat, shellfish, green leafy vegetables, and eggs.  If you become constipated:  Drink plenty of water.  Eat fruits and vegetables that are high in water and fiber, such as spinach, carrots, raspberries, apples, and mango. Medicines  Take over-the-counter and prescription medicines only as told by your health care provider.  Do not change medicines without talking with your health care provider.  Aspirin or medicines that contain aspirin may make the bleeding worse. Do not take those medicines:  During the week before your period.  During your period.  If you were prescribed iron pills, take them as told by your health care provider. Iron pills help to replace iron that your body loses because of this condition. Activity  If you need to change your sanitary pad or tampon more than one time every 2 hours:  Lie in bed with your feet raised (elevated).  Place a cold pack on your lower abdomen.  Rest as much as possible until the bleeding stops or slows down.  Do not try to lose weight until the bleeding has stopped and your blood iron level is back to normal. Other Instructions  For two months, write down:  When your period starts.  When your period ends.  When any abnormal bleeding occurs.  What problems you notice.  Keep all follow up visits as told by your health care provider. This is  important. SEEK MEDICAL CARE IF:  You get light-headed or weak.  You have nausea and vomiting.  You cannot eat or drink without vomiting.  You feel dizzy or have diarrhea while you are taking medicines.  You are taking birth control pills or hormones, and you want to change them or stop taking them. SEEK IMMEDIATE MEDICAL CARE IF:  You develop a fever or chills.  You need to change your sanitary pad or tampon more than one time per hour.  Your bleeding becomes heavier, or your flow contains clots more often.  You develop pain in your abdomen.  You lose consciousness.  You develop a rash.   This information is not intended to replace advice given to you by your health care provider. Make sure you discuss any questions you have with your health care provider.   Document Released: 07/27/2000 Document Revised: 04/20/2015 Document Reviewed: 10/25/2014 Elsevier Interactive Patient Education 2016 Elsevier Inc. Uterine Fibroids Uterine fibroids are tissue masses (tumors). They are also called leiomyomas. They can develop inside of a woman's womb (uterus). They can grow very large. Fibroids are not cancerous (benign). Most fibroids do not require medical treatment. HOME CARE  Keep all follow-up visits as told by your doctor. This is important.  Take medicines only as told by your doctor.  If you were prescribed a hormone treatment, take the hormone medicines exactly as told.  Do not take aspirin. It can cause bleeding.  Ask your doctor about taking iron pills and increasing the amount of dark green, leafy vegetables in your diet. These actions can  help to boost your blood iron levels.  Pay close attention to your period. Tell your doctor about any changes, such as:  Increased blood flow. This may require you to use more pads or tampons than usual per month.  A change in the number of days that your period lasts per month.  A change in symptoms that come with your period,  such as back pain or cramping in your belly area (abdomen). GET HELP IF:  You have pain in your back or the area between your hip bones (pelvic area) that is not controlled by medicines.  You have pain in your abdomen that is not controlled with medicines.  You have an increase in bleeding between and during periods.  You soak tampons or pads in a half hour or less.  You feel lightheaded.  You feel extra tired.  You feel weak. GET HELP RIGHT AWAY IF:   You pass out (faint).  You have a sudden increase in pelvic pain.   This information is not intended to replace advice given to you by your health care provider. Make sure you discuss any questions you have with your health care provider.   Document Released: 09/01/2010 Document Revised: 08/20/2014 Document Reviewed: 01/26/2014 Elsevier Interactive Patient Education [employer].

## 2015-07-26 NOTE — MAU Provider Note (Signed)
History     CSN: [id number]  Arrival date and time: 07/26/15 [id number]   First Provider Initiated Contact with Patient 07/26/15 2143      No chief complaint on file.  HPI Ms. Lori Andrews is a 39 y.o. G0P0000 who presents to MAU today with complaint of RLQ abdominal pain for the last few days. The patient was seen in MAU in July and had [place] and CT scan. CT scan was negative and US showed 2 small fibroids. She states at that time the pain was on the left side only. She now has pain on her right side, almost in the inguinal region. She denies N/V/D, fever, vaginal discharge, bleeding or UTI symptoms. She does endorse a slight increased in urinary frequency without dysuria or urgency. She does also endorse constipation, although she had a BM today, states that it was firm. She takes Ibuprofen for pain. Last dose was around 5 pm. She rates pain now at 5/10. She has tried Vicodin in the past with some relief as well. She has a history of AUB and has been seen at [facility]. She has been seen at [facility] in the past as well, but not since she lost her insurance. She plans to return when she gets her insurance back. She is sexually active with one female partner and does not use toys.    OB History    Gravida Para Term Preterm AB TAB SAB Ectopic Multiple Living   0 0 0 0 0 0 0 0 0 0       Past Medical History  Diagnosis Date  . Heart murmur     occ rapid beats- pt thinks related to caffeine use  . Anemia   . Endometriosis 02/29/2012  . Pelvic pain in female 02/29/2012  . Trichimoniasis     Over 10 years ago  . Chlamydia     Over ten years ago  . Complication of anesthesia     Difficulty waking up from anesthetic    Past Surgical History  Procedure Laterality Date  . Laparoscopy  02/29/2012    Procedure: LAPAROSCOPY OPERATIVE;  Surgeon: Elveria Royals, MD;  Location: [facility];  Service: Gynecology;  Laterality: N/A;  w/ Chromopertubation  . Dilation and curettage of uterus  02/29/2012     Procedure: DILATATION AND CURETTAGE;  Surgeon: Elveria Royals, MD;  Location: [facility];  Service: Gynecology;  Laterality: N/A;  . Endometrial ablation  02/29/2012    Procedure: ENDOMETRIAL ABLATION;  Surgeon: Elveria Royals, MD;  Location: [facility];  Service: Gynecology;  Laterality: N/A;  . Breast surgery      Lump removed at 39 yo    Family History  Problem Relation Age of Onset  . Diabetes Mother   . Diabetes Father   . Hyperlipidemia Father   . Diabetes Sister     Social History  Substance Use Topics  . Smoking status: Never Smoker   . Smokeless tobacco: Never Used  . Alcohol Use: Yes     Comment: Occasionallly     Allergies:  Allergies  Allergen Reactions  . Codeine Itching    Says she can take hydrocodone w/o problem    . Red Dye Itching    Prescriptions prior to admission  Medication Sig Dispense Refill Last Dose  . IRON PO Take 1 tablet by mouth as needed (low iron).   Past Week at Unknown time  . [DISCONTINUED] HYDROcodone-acetaminophen (NORCO/VICODIN) 5-325 MG per tablet Take 1-2 tablets by mouth every  4 (four) hours as needed. 20 tablet 0 Past Week at Unknown time  . Cyanocobalamin (B-12 PO) Take 1 tablet by mouth once a week.   More than a month at Unknown time  . cyclobenzaprine (FLEXERIL) 10 MG tablet Take 1 tablet (10 mg total) by mouth 2 (two) times daily as needed for muscle spasms. 20 tablet 0 More than a month at Unknown time    Review of Systems  Constitutional: Negative for fever and malaise/fatigue.  Gastrointestinal: Positive for abdominal pain and constipation. Negative for nausea, vomiting and diarrhea.  Genitourinary: Positive for frequency. Negative for dysuria and urgency.       Neg - vaginal bleeding, discharge   Physical Exam   Blood pressure 138/81, pulse 86, temperature 98.4 F (36.9 C), temperature source Oral, resp. rate 20, height 5\' 3"  (1.6 m), weight 186 lb 6 oz (84.539 kg), last menstrual period 06/25/2015.  Physical Exam   Nursing note and vitals reviewed. Constitutional: She is oriented to person, place, and time. She appears well-developed and well-nourished. No distress.  HENT:  Head: Normocephalic and atraumatic.  Cardiovascular: Normal rate.   Respiratory: Effort normal.  GI: Soft. She exhibits no distension and no mass. There is no tenderness. There is no rebound and no guarding.  Genitourinary: Uterus is enlarged (slightly). Uterus is not tender. Cervix exhibits no motion tenderness, no discharge and no friability. Right adnexum displays no mass and no tenderness. Left adnexum displays no mass and no tenderness. No bleeding in the vagina. Vaginal discharge (small amount of thin white discharge noted) found.  Neurological: She is alert and oriented to person, place, and time.  Skin: Skin is warm and dry. No erythema.  Psychiatric: She has a normal mood and affect.    Results for orders placed or performed during the hospital encounter of 07/26/15 (from the past 24 hour(s))  CBC with Differential/Platelet     Status: Abnormal   Collection Time: 07/26/15  8:35 PM  Result Value Ref Range   WBC 10.4 4.0 - 10.5 K/uL   RBC 4.79 3.87 - 5.11 MIL/uL   Hemoglobin 9.1 (L) 12.0 - 15.0 g/dL   HCT 31.3 (L) 36.0 - 46.0 %   MCV 65.3 (L) 78.0 - 100.0 fL   MCH 19.0 (L) 26.0 - 34.0 pg   MCHC 29.1 (L) 30.0 - 36.0 g/dL   RDW 20.0 (H) 11.5 - 15.5 %   Platelets 310 150 - 400 K/uL   Neutrophils Relative % 55 %   Neutro Abs 5.7 1.7 - 7.7 K/uL   Lymphocytes Relative 34 %   Lymphs Abs 3.5 0.7 - 4.0 K/uL   Monocytes Relative 8 %   Monocytes Absolute 0.8 0.1 - 1.0 K/uL   Eosinophils Relative 2 %   Eosinophils Absolute 0.2 0.0 - 0.7 K/uL   Basophils Relative 1 %   Basophils Absolute 0.1 0.0 - 0.1 K/uL  Urinalysis, Routine w reflex microscopic (not at [facility]     Status: Abnormal   Collection Time: 07/26/15  8:55 PM  Result Value Ref Range   Color, Urine YELLOW YELLOW   APPearance CLEAR CLEAR   Specific Gravity, Urine  >1.030 (H) 1.005 - 1.030   pH 6.0 5.0 - 8.0   Glucose, UA NEGATIVE NEGATIVE mg/dL   Hgb urine dipstick NEGATIVE NEGATIVE   Bilirubin Urine NEGATIVE NEGATIVE   Ketones, ur NEGATIVE NEGATIVE mg/dL   Protein, ur NEGATIVE NEGATIVE mg/dL   Nitrite NEGATIVE NEGATIVE   Leukocytes, UA NEGATIVE NEGATIVE  Wet  prep, genital     Status: Abnormal   Collection Time: 07/26/15  9:52 PM  Result Value Ref Range   Yeast Wet Prep HPF POC NONE SEEN NONE SEEN   Trich, Wet Prep NONE SEEN NONE SEEN   Clue Cells Wet Prep HPF POC NONE SEEN NONE SEEN   WBC, Wet Prep HPF POC FEW (A) NONE SEEN   Sperm NONE SEEN     MAU Course  Procedures None  MDM UPT not performed. Refuses UPT because she is in a same sex relationship.  UA, wet prep, GC/Chlamydia, CBC, HIV and RPR today  Assessment and Plan  A: Fibroid uterus Pelvic pain  P: Discharge home Rx for Ibuprofen and Vicodin given to patient Warning signs for worsening condition discussed Outpatient [place] ordered to re-evaluate fibroids. They will call with appointment date/time.  Patient advised to follow-up with [facility]. They will call her with an appointment date/time.  Patient may return to MAU as needed or if her condition were to change or worsen   Luvenia Redden, PA-C  07/26/2015, 10:21 PM

## 2015-07-27 LAB — RPR: RPR Ser Ql: NONREACTIVE

## 2015-07-27 LAB — GC/CHLAMYDIA PROBE AMP (~~LOC~~) NOT AT ARMC
Chlamydia: NEGATIVE
Neisseria Gonorrhea: NEGATIVE

## 2015-08-04 ENCOUNTER — Ambulatory Visit (HOSPITAL_COMMUNITY)
Admission: RE | Admit: 2015-08-04 | Discharge: 2015-08-04 | Disposition: A | Discharge status: Home / Self Care | Payer: 59 | Source: Ambulatory Visit | Attending: Medical | Admitting: Medical

## 2015-08-04 DIAGNOSIS — D252 Subserosal leiomyoma of uterus: Secondary | ICD-10-CM | POA: Insufficient documentation

## 2015-08-04 DIAGNOSIS — R1031 Right lower quadrant pain: Secondary | ICD-10-CM | POA: Insufficient documentation

## 2015-08-18 ENCOUNTER — Encounter: Payer: 59 | Admitting: Obstetrics & Gynecology

## 2015-12-29 ENCOUNTER — Ambulatory Visit (INDEPENDENT_AMBULATORY_CARE_PROVIDER_SITE_OTHER): Payer: PRIVATE HEALTH INSURANCE | Admitting: Family Medicine

## 2015-12-29 VITALS — BP 136/80 | HR 97 | Temp 98.7°F | Resp 18 | Ht 63.5 in | Wt 190.8 lb

## 2015-12-29 DIAGNOSIS — J309 Allergic rhinitis, unspecified: Secondary | ICD-10-CM | POA: Diagnosis not present

## 2015-12-29 DIAGNOSIS — R5383 Other fatigue: Secondary | ICD-10-CM

## 2015-12-29 DIAGNOSIS — E669 Obesity, unspecified: Secondary | ICD-10-CM

## 2015-12-29 DIAGNOSIS — N911 Secondary amenorrhea: Secondary | ICD-10-CM | POA: Diagnosis not present

## 2015-12-29 DIAGNOSIS — D649 Anemia, unspecified: Secondary | ICD-10-CM

## 2015-12-29 LAB — CBC
HEMATOCRIT: 35.7 % (ref 35.0–45.0)
Hemoglobin: 10.9 g/dL — ABNORMAL LOW (ref 11.7–15.5)
MCH: 21.9 pg — ABNORMAL LOW (ref 27.0–33.0)
MCHC: 30.5 g/dL — ABNORMAL LOW (ref 32.0–36.0)
MCV: 71.7 fL — AB (ref 80.0–100.0)
Platelets: 256 10*3/uL (ref 140–400)
RBC: 4.98 MIL/uL (ref 3.80–5.10)
RDW: 18.3 % — AB (ref 11.0–15.0)
WBC: 11.4 10*3/uL — ABNORMAL HIGH (ref 3.8–10.8)

## 2015-12-29 LAB — COMPLETE METABOLIC PANEL WITH GFR
ALT: 16 U/L (ref 6–29)
AST: 16 U/L (ref 10–30)
Albumin: 4 g/dL (ref 3.6–5.1)
Alkaline Phosphatase: 89 U/L (ref 33–115)
BUN: 8 mg/dL (ref 7–25)
CHLORIDE: 106 mmol/L (ref 98–110)
CO2: 25 mmol/L (ref 20–31)
CREATININE: 0.81 mg/dL (ref 0.50–1.10)
Calcium: 9 mg/dL (ref 8.6–10.2)
Glucose, Bld: 114 mg/dL — ABNORMAL HIGH (ref 65–99)
Potassium: 3.8 mmol/L (ref 3.5–5.3)
Sodium: 139 mmol/L (ref 135–146)
Total Bilirubin: 0.2 mg/dL (ref 0.2–1.2)
Total Protein: 7 g/dL (ref 6.1–8.1)

## 2015-12-29 LAB — HEMOGLOBIN A1C
HEMOGLOBIN A1C: 5.7 % — AB (ref <5.7)
MEAN PLASMA GLUCOSE: 117 mg/dL

## 2015-12-29 LAB — TSH: TSH: 1.5 mIU/L

## 2015-12-29 LAB — IRON,TIBC AND FERRITIN PANEL
%SAT: 13 % (ref 11–50)
Ferritin: 10 ng/mL (ref 10–154)
IRON: 41 ug/dL (ref 40–190)
TIBC: 313 ug/dL (ref 250–450)

## 2015-12-29 NOTE — Progress Notes (Signed)
Subjective:    Patient ID: Lori Andrews, female    DOB: 13-Dec-1975, 40 y.o.   MRN: [id number]  HPI This is a 40 yo female who presents today with several weeks of intermittent dry cough, no runny nose, itchy eyes, some headache. Itchy ears. No sore throat. Occasional SOB that she thinks is related to her weight gain. Has tried Tylenol and Alkaseltzer cold medicine, some Bayer preparation with minimal relief. No meds today. Sleep ok. Feels fatigued. Works two jobs will be dropping second job next month. Getting enough sleep, doesn't feel rested. Walks a lot at both jobs.   She has a long history of amenorrhea and rarely has more than 2 periods a year. She has seen gyn in the past but not recently.   Past Medical History  Diagnosis Date  . Heart murmur     occ rapid beats- pt thinks related to caffeine use  . Anemia   . Endometriosis 02/29/2012  . Pelvic pain in female 02/29/2012  . Trichimoniasis     Over 10 years ago  . Chlamydia     Over ten years ago  . Complication of anesthesia     Difficulty waking up from anesthetic   Past Surgical History  Procedure Laterality Date  . Laparoscopy  02/29/2012    Procedure: LAPAROSCOPY OPERATIVE;  Surgeon: Elveria Royals, MD;  Location: [facility];  Service: Gynecology;  Laterality: N/A;  w/ Chromopertubation  . Dilation and curettage of uterus  02/29/2012    Procedure: DILATATION AND CURETTAGE;  Surgeon: Elveria Royals, MD;  Location: [facility];  Service: Gynecology;  Laterality: N/A;  . Endometrial ablation  02/29/2012    Procedure: ENDOMETRIAL ABLATION;  Surgeon: Elveria Royals, MD;  Location: [facility];  Service: Gynecology;  Laterality: N/A;  . Breast surgery      Lump removed at 40 yo   Family History  Problem Relation Age of Onset  . Diabetes Mother   . Diabetes Father   . Hyperlipidemia Father   . Diabetes Sister    Social History  Substance Use Topics  . Smoking status: Never Smoker   . Smokeless tobacco: Never Used  . Alcohol Use:  Yes     Comment: Occasionallly       Review of Systems Per HPI     Objective:   Physical Exam  Constitutional: She is oriented to person, place, and time. She appears well-developed and well-nourished.  HENT:  Head: Normocephalic and atraumatic.  Right Ear: Tympanic membrane, external ear and ear canal normal.  Left Ear: Tympanic membrane, external ear and ear canal normal.  Nose: Mucosal edema and rhinorrhea present.  Mouth/Throat: Uvula is midline and oropharynx is clear and moist.  Cardiovascular: Normal rate, regular rhythm and normal heart sounds.   Pulmonary/Chest: Effort normal and breath sounds normal.  Neurological: She is alert and oriented to person, place, and time.  Skin: Skin is warm and dry.  Psychiatric: She has a normal mood and affect. Her behavior is normal. Judgment and thought content normal.  Vitals reviewed.     BP 136/80 mmHg  Pulse 97  Temp(Src) 98.7 F (37.1 C) (Oral)  Resp 18  Ht 5' 3.5" (1.613 m)  Wt 190 lb 12.8 oz (86.546 kg)  BMI 33.26 kg/m2  SpO2 97% Wt Readings from Last 3 Encounters:  12/29/15 190 lb 12.8 oz (86.546 kg)  07/26/15 186 lb 6 oz (84.539 kg)  01/12/15 182 lb 4.8 oz (82.691 kg)  Assessment & Plan:  1. Allergic rhinitis, unspecified allergic rhinitis type - Provided written and verbal information regarding diagnosis and treatment. - Recommended OTC long acting antihistamine, antihistamine eye drops, artificial tears.  2. Anemia, unspecified anemia type - Iron, TIBC and Ferritin Panel  3. Other fatigue - CBC - TSH - Hemoglobin A1c - COMPLETE METABOLIC PANEL WITH GFR  4. Obesity - provided information about Mediterranean Diet and encouraged her to eliminate soda and fast food from her diet  - TSH - Hemoglobin A1c - COMPLETE METABOLIC PANEL WITH GFR  5. Amenorrhea, secondary - Ambulatory referral to Gynecology   Clarene Reamer, FNP-BC  [facility] [place]  [facility]  01/01/2016 8:26 AM

## 2015-12-29 NOTE — Patient Instructions (Addendum)
Please take a daily antihistamine like Allegra, Zyrtec, Claritin- generic is fine  For your itchy eyes try an over the counter antihistamine eye drop and some artificial tears  To lose weight- avoid soda, sweet tea, juice. Limit how much you eat out. Eat lots of vegetables, fruits, lean proteins and complex carbs (brown rice, sweet potatoes, quinoa).   I am going to put in a referral for you to see the gynecologist at [facility]     Why follow it? Research shows. . Those who follow the Mediterranean diet have a reduced risk of heart disease  . The diet is associated with a reduced incidence of Parkinson's and Alzheimer's diseases . People following the diet may have longer life expectancies and lower rates of chronic diseases  . The Dietary Guidelines for Americans recommends the Mediterranean diet as an eating plan to promote health and prevent disease  What Is the Mediterranean Diet?  . Healthy eating plan based on typical foods and recipes of Mediterranean-style cooking . The diet is primarily a plant based diet; these foods should make up a majority of meals   Starches - Plant based foods should make up a majority of meals - They are an important sources of vitamins, minerals, energy, antioxidants, and fiber - Choose whole grains, foods high in fiber and minimally processed items  - Typical grain sources include wheat, oats, barley, corn, brown rice, bulgar, farro, millet, polenta, couscous  - Various types of beans include chickpeas, lentils, fava beans, black beans, white beans   Fruits  Veggies - Large quantities of antioxidant rich fruits & veggies; 6 or more servings  - Vegetables can be eaten raw or lightly drizzled with oil and cooked  - Vegetables common to the traditional Mediterranean Diet include: artichokes, arugula, beets, broccoli, brussel sprouts, cabbage, carrots, celery, collard greens, cucumbers, eggplant, kale, leeks, lemons, lettuce, mushrooms, okra, onions,  peas, peppers, potatoes, pumpkin, radishes, rutabaga, shallots, spinach, sweet potatoes, turnips, zucchini - Fruits common to the Mediterranean Diet include: apples, apricots, avocados, cherries, clementines, dates, figs, grapefruits, grapes, melons, nectarines, oranges, peaches, pears, pomegranates, strawberries, tangerines  Fats - Replace butter and margarine with healthy oils, such as olive oil, canola oil, and tahini  - Limit nuts to no more than a handful a day  - Nuts include walnuts, almonds, pecans, pistachios, pine nuts  - Limit or avoid candied, honey roasted or heavily salted nuts - Olives are central to the [employer] - can be eaten whole or used in a variety of dishes   Meats Protein - Limiting red meat: no more than a few times a month - When eating red meat: choose lean cuts and keep the portion to the size of deck of cards - Eggs: approx. 0 to 4 times a week  - Fish and lean poultry: at least 2 a week  - Healthy protein sources include, chicken, [place], lean beef, lamb - Increase intake of seafood such as tuna, salmon, trout, mackerel, shrimp, scallops - Avoid or limit high fat processed meats such as sausage and bacon  Dairy - Include moderate amounts of low fat dairy products  - Focus on healthy dairy such as fat free yogurt, skim milk, low or reduced fat cheese - Limit dairy products higher in fat such as whole or 2% milk, cheese, ice cream  Alcohol - Moderate amounts of red wine is ok  - No more than 5 oz daily for women (all ages) and men older than age 32  -  No more than 10 oz of wine daily for men younger than 20  Other - Limit sweets and other desserts  - Use herbs and spices instead of salt to flavor foods  - Herbs and spices common to the traditional Mediterranean Diet include: basil, bay leaves, chives, cloves, cumin, fennel, garlic, lavender, marjoram, mint, oregano, parsley, pepper, rosemary, sage, savory, sumac, tarragon, thyme   It's not just a diet,  it's a lifestyle:  . The Mediterranean diet includes lifestyle factors typical of those in the region  . Foods, drinks and meals are best eaten with others and savored . Daily physical activity is important for overall good health . This could be strenuous exercise like running and aerobics . This could also be more leisurely activities such as walking, housework, yard-work, or taking the stairs . Moderation is the key; a balanced and healthy diet accommodates most foods and drinks . Consider portion sizes and frequency of consumption of certain foods   Meal Ideas & Options:  . Breakfast:  o Whole wheat toast or whole wheat English muffins with peanut butter & hard boiled egg o Steel cut oats topped with apples & cinnamon and skim milk  o Fresh fruit: banana, strawberries, melon, berries, peaches  o Smoothies: strawberries, bananas, greek yogurt, peanut butter o Low fat greek yogurt with blueberries and granola  o Egg white omelet with spinach and mushrooms o Breakfast couscous: whole wheat couscous, apricots, skim milk, cranberries  . Sandwiches:  o Hummus and grilled vegetables (peppers, zucchini, squash) on whole wheat bread   o Grilled chicken on whole wheat pita with lettuce, tomatoes, cucumbers or tzatziki  o Tuna salad on whole wheat bread: tuna salad made with greek yogurt, olives, red peppers, capers, green onions o Garlic rosemary lamb pita: lamb sauted with garlic, rosemary, salt & pepper; add lettuce, cucumber, greek yogurt to pita - flavor with lemon juice and black pepper  . Seafood:  o Mediterranean grilled salmon, seasoned with garlic, basil, parsley, lemon juice and black pepper o Shrimp, lemon, and spinach whole-grain pasta salad made with low fat greek yogurt  o Seared scallops with lemon orzo  o Seared tuna steaks seasoned salt, pepper, coriander topped with tomato mixture of olives, tomatoes, olive oil, minced garlic, parsley, green onions and cappers  . Meats:   o Herbed greek chicken salad with kalamata olives, cucumber, feta  o Red bell peppers stuffed with spinach, bulgur, lean ground beef (or lentils) & topped with feta   o Kebabs: skewers of chicken, tomatoes, onions, zucchini, squash  o [place] burgers: made with red onions, mint, dill, lemon juice, feta cheese topped with roasted red peppers . Vegetarian o Cucumber salad: cucumbers, artichoke hearts, celery, red onion, feta cheese, tossed in olive oil & lemon juice  o Hummus and whole grain pita points with a greek salad (lettuce, tomato, feta, olives, cucumbers, red onion) o Lentil soup with celery, carrots made with vegetable broth, garlic, salt and pepper  o Tabouli salad: parsley, bulgur, mint, scallions, cucumbers, tomato, radishes, lemon juice, olive oil, salt and pepper.       Allergic Rhinitis Allergic rhinitis is when the mucous membranes in the nose respond to allergens. Allergens are particles in the air that cause your body to have an allergic reaction. This causes you to release allergic antibodies. Through a chain of events, these eventually cause you to release histamine into the blood stream. Although meant to protect the body, it is this release of histamine that causes  your discomfort, such as frequent sneezing, congestion, and an itchy, runny nose.  CAUSES Seasonal allergic rhinitis (hay fever) is caused by pollen allergens that may come from grasses, trees, and weeds. Year-round allergic rhinitis (perennial allergic rhinitis) is caused by allergens such as house dust mites, pet dander, and mold spores. SYMPTOMS  Nasal stuffiness (congestion).  Itchy, runny nose with sneezing and tearing of the eyes. DIAGNOSIS Your health care provider can help you determine the allergen or allergens that trigger your symptoms. If you and your health care provider are unable to determine the allergen, skin or blood testing may be used. Your health care provider will diagnose your condition  after taking your health history and performing a physical exam. Your health care provider may assess you for other related conditions, such as asthma, pink eye, or an ear infection. TREATMENT Allergic rhinitis does not have a cure, but it can be controlled by:  Medicines that block allergy symptoms. These may include allergy shots, nasal sprays, and oral antihistamines.  Avoiding the allergen. Hay fever may often be treated with antihistamines in pill or nasal spray forms. Antihistamines block the effects of histamine. There are over-the-counter medicines that may help with nasal congestion and swelling around the eyes. Check with your health care provider before taking or giving this medicine. If avoiding the allergen or the medicine prescribed do not work, there are many new medicines your health care provider can prescribe. Stronger medicine may be used if initial measures are ineffective. Desensitizing injections can be used if medicine and avoidance does not work. Desensitization is when a patient is given ongoing shots until the body becomes less sensitive to the allergen. Make sure you follow up with your health care provider if problems continue. HOME CARE INSTRUCTIONS It is not possible to completely avoid allergens, but you can reduce your symptoms by taking steps to limit your exposure to them. It helps to know exactly what you are allergic to so that you can avoid your specific triggers. SEEK MEDICAL CARE IF:  You have a fever.  You develop a cough that does not stop easily (persistent).  You have shortness of breath.  You start wheezing.  Symptoms interfere with normal daily activities.   This information is not intended to replace advice given to you by your health care provider. Make sure you discuss any questions you have with your health care provider.   Document Released: 04/24/2001 Document Revised: 08/20/2014 Document Reviewed: 04/06/2013 Elsevier Interactive Patient  Education 2016 [employer].     IF you received an x-ray today, you will receive an invoice from [facility]. Please contact [facility] at [phone] with questions or concerns regarding your invoice.   IF you received labwork today, you will receive an invoice from [employer]. Please contact [facility] at [phone] with questions or concerns regarding your invoice.   Our billing staff will not be able to assist you with questions regarding bills from these companies.  You will be contacted with the lab results as soon as they are available. The fastest way to get your results is to activate your My Chart account. Instructions are located on the last page of this paperwork. If you have not heard from [place] regarding the results in 2 weeks, please contact this office.

## 2015-12-31 ENCOUNTER — Telehealth: Payer: Self-pay | Admitting: Emergency Medicine

## 2015-12-31 NOTE — Telephone Encounter (Signed)
Pt called for lab results Results given with instructions to monitor carbohydrates, make healthier choices and soda intake Informed to schedule f/u appt in 3 months Verbalized understanding

## 2016-01-03 ENCOUNTER — Telehealth: Payer: Self-pay

## 2016-01-03 NOTE — Telephone Encounter (Signed)
Patient called in stating that she saw Debbie last week and that she was told to take Zyrtec for her itchy eyes and that it wouldn't make her sleepy, but patient states that she takes one tablet a day and it keeps her knocked out almost all day and she can not work while taking it. She would like someone to call her back and let her know what she needs to do because she is not getting any better, I told patient she may need to come back in and get seen again but she doesn't want to do that she just wants a nurse or Debbie to call her.  Her call back number is [phone]

## 2016-01-04 NOTE — Telephone Encounter (Signed)
Attempted to call patient to discuss. No answer. Will try later.

## 2016-01-06 NOTE — Telephone Encounter (Signed)
Called patient. No answer. Left her a message to try a different antihistamine like Claritin or Allegra or she can try Flonase or Nasacort.

## 2016-03-26 ENCOUNTER — Ambulatory Visit (INDEPENDENT_AMBULATORY_CARE_PROVIDER_SITE_OTHER): Payer: 59 | Admitting: Obstetrics & Gynecology

## 2016-03-26 DIAGNOSIS — D251 Intramural leiomyoma of uterus: Secondary | ICD-10-CM | POA: Insufficient documentation

## 2016-03-26 MED ORDER — LEVONORGEST-ETH ESTRAD 91-DAY 0.15-0.03 &0.01 MG PO TABS: 1.0000 | ORAL_TABLET | Freq: Every day | ORAL | 4 refills | Status: DC

## 2016-03-26 NOTE — Progress Notes (Signed)
Subjective:    Lori Andrews is a 40 y.o. Separated AA P0 female who presents for an annual exam. She has DUB for years. She has tried for years to regulate it with OCPs. She had a d&c and laparoscopy in 2014 with Dr. Radene Knee and was told that she has endometriosis. Her most recent u/s showed 2 fibroids. She sometimes skips several months without a period, has a period only about 2 times per year and then it will be heavy and last for weeks. The patient is sexually active with a woman at this point.  Menstrual History: OB History    Gravida Para Term Preterm AB Living   0 0 0 0 0 0   SAB TAB Ectopic Multiple Live Births   0 0 0 0            Review of Systems   Objective:      Assessment:       Plan:     dub/endometriosis- start Camrese and RTC in a month for annual exam/BP check

## 2016-04-30 ENCOUNTER — Ambulatory Visit (INDEPENDENT_AMBULATORY_CARE_PROVIDER_SITE_OTHER): Payer: Self-pay | Admitting: Obstetrics & Gynecology

## 2016-04-30 ENCOUNTER — Encounter: Payer: Self-pay | Admitting: Obstetrics & Gynecology

## 2016-04-30 VITALS — BP 141/77 | HR 105 | Ht 63.0 in | Wt 196.0 lb

## 2016-04-30 DIAGNOSIS — Z1151 Encounter for screening for human papillomavirus (HPV): Secondary | ICD-10-CM

## 2016-04-30 DIAGNOSIS — Z124 Encounter for screening for malignant neoplasm of cervix: Secondary | ICD-10-CM

## 2016-04-30 DIAGNOSIS — Z01419 Encounter for gynecological examination (general) (routine) without abnormal findings: Secondary | ICD-10-CM

## 2016-04-30 MED ORDER — LEVONORGEST-ETH ESTRAD 91-DAY 0.1-0.02 & 0.01 MG PO TABS: 1.0000 | ORAL_TABLET | Freq: Every day | ORAL | 4 refills | Status: DC

## 2016-04-30 NOTE — Progress Notes (Signed)
Subjective:    Lori Andrews is a 40 y.o. S AA P) female who presents for an annual exam. The patient has no complaints today. She is still having irregular bleeding and was unable to afford the OCPs when she did not have insurance. She now has BCBS and needs a new prescription. The patient is sexually active. GYN screening history: last pap: was normal. The patient wears seatbelts: yes. The patient participates in regular exercise: yes. Has the patient ever been transfused or tattooed?: no. The patient reports that there is not domestic violence in her life.   Menstrual History: OB History    Gravida Para Term Preterm AB Living   0 0 0 0 0 0   SAB TAB Ectopic Multiple Live Births   0 0 0 0        Menarche age: 60 No LMP recorded. Patient is not currently having periods (Reason: Other).    The following portions of the patient's history were reviewed and updated as appropriate: allergies, current medications, past family history, past medical history, past social history, past surgical history and problem list.  Review of Systems Pertinent items are noted in HPI.   Working at [employer], receiving clerk Monogamous for about 2 years.  No breast/gyn/colon cancer +DM   Objective:    BP (!) 141/77   Pulse (!) 105   Ht 5\' 3"  (1.6 m)   Wt 196 lb (88.9 kg)   BMI 34.72 kg/m   General Appearance:    Alert, cooperative, no distress, appears stated age  Head:    Normocephalic, without obvious abnormality, atraumatic  Eyes:    PERRL, conjunctiva/corneas clear, EOM's intact, fundi    benign, both eyes  Ears:    Normal TM's and external ear canals, both ears  Nose:   Nares normal, septum midline, mucosa normal, no drainage    or sinus tenderness  Throat:   Lips, mucosa, and tongue normal; teeth and gums normal  Neck:   Supple, symmetrical, trachea midline, no adenopathy;    thyroid:  no enlargement/tenderness/nodules; no carotid   bruit or JVD  Back:     Symmetric, no curvature, ROM  normal, no CVA tenderness  Lungs:     Clear to auscultation bilaterally, respirations unlabored  Chest Wall:    No tenderness or deformity   Heart:    Regular rate and rhythm, S1 and S2 normal, no murmur, rub   or gallop  Breast Exam:    No tenderness, masses, or nipple abnormality  Abdomen:     Soft, non-tender, bowel sounds active all four quadrants,    no masses, no organomegaly  Genitalia:    Normal female without lesion, discharge or tenderness, NSSA, no palpable adnexal masses     Extremities:   Extremities normal, atraumatic, no cyanosis or edema  Pulses:   2+ and symmetric all extremities  Skin:   Skin color, texture, turgor normal, no rashes or lesions  Lymph nodes:   Cervical, supraclavicular, and axillary nodes normal  Neurologic:   CNII-XII intact, normal strength, sensation and reflexes    throughout   .    Assessment:    Healthy female exam.    Plan:     Thin prep Pap smear. with cotesting Strongly rec'd diet changes to help prevent DM Refer to bariatric clinic

## 2016-05-01 LAB — CYTOLOGY - PAP

## 2016-06-04 ENCOUNTER — Telehealth: Payer: Self-pay | Admitting: General Practice

## 2016-06-04 NOTE — Telephone Encounter (Signed)
Patient called and left message stating she is having some side effects from the [facility] Dr Hulan Fray gave her. Patient states she is having cramps, nausea and a loss of appetite. Called patient and she states she just left the doctor's office and found out she has a kidney stone and last week she had the stomach bug. Patient states she doesn't know if it is her other illnesses or the pills making her feel that way. Told patient it is hard to say but she should continue taking the pills every day. Told patient once she is feeling well that will be a better indicator if it is the pills or the fact she has been sick. Patient verbalized understanding & had no questions

## 2016-06-10 ENCOUNTER — Encounter (HOSPITAL_COMMUNITY): Payer: Self-pay | Admitting: *Deleted

## 2016-06-10 ENCOUNTER — Emergency Department (HOSPITAL_COMMUNITY): Payer: BLUE CROSS/BLUE SHIELD

## 2016-06-10 ENCOUNTER — Emergency Department (HOSPITAL_COMMUNITY)
Admission: EM | Admit: 2016-06-10 | Discharge: 2016-06-10 | Disposition: A | Discharge status: Home / Self Care | Payer: BLUE CROSS/BLUE SHIELD | Source: Home / Self Care | Attending: Emergency Medicine | Admitting: Emergency Medicine

## 2016-06-10 ENCOUNTER — Inpatient Hospital Stay (HOSPITAL_COMMUNITY)
Admission: AD | Admit: 2016-06-10 | Discharge: 2016-06-10 | Disposition: A | Discharge status: Hospice | Payer: BLUE CROSS/BLUE SHIELD | Source: Ambulatory Visit | Attending: Obstetrics & Gynecology | Admitting: Obstetrics & Gynecology

## 2016-06-10 DIAGNOSIS — R1030 Lower abdominal pain, unspecified: Secondary | ICD-10-CM | POA: Insufficient documentation

## 2016-06-10 DIAGNOSIS — N3090 Cystitis, unspecified without hematuria: Secondary | ICD-10-CM | POA: Insufficient documentation

## 2016-06-10 DIAGNOSIS — N200 Calculus of kidney: Secondary | ICD-10-CM | POA: Diagnosis not present

## 2016-06-10 DIAGNOSIS — R109 Unspecified abdominal pain: Secondary | ICD-10-CM

## 2016-06-10 DIAGNOSIS — R110 Nausea: Secondary | ICD-10-CM | POA: Diagnosis present

## 2016-06-10 DIAGNOSIS — R112 Nausea with vomiting, unspecified: Secondary | ICD-10-CM | POA: Insufficient documentation

## 2016-06-10 DIAGNOSIS — Z3202 Encounter for pregnancy test, result negative: Secondary | ICD-10-CM | POA: Diagnosis not present

## 2016-06-10 LAB — URINALYSIS, ROUTINE W REFLEX MICROSCOPIC
BILIRUBIN URINE: NEGATIVE
Glucose, UA: NEGATIVE mg/dL
Ketones, ur: NEGATIVE mg/dL
NITRITE: NEGATIVE
PROTEIN: 30 mg/dL — AB
SPECIFIC GRAVITY, URINE: 1.02 (ref 1.005–1.030)
pH: 5.5 (ref 5.0–8.0)

## 2016-06-10 LAB — I-STAT CHEM 8, ED
BUN: 11 mg/dL (ref 6–20)
CALCIUM ION: 1.04 mmol/L — AB (ref 1.15–1.40)
CHLORIDE: 107 mmol/L (ref 101–111)
CREATININE: 1.7 mg/dL — AB (ref 0.44–1.00)
GLUCOSE: 94 mg/dL (ref 65–99)
HCT: 33 % — ABNORMAL LOW (ref 36.0–46.0)
Hemoglobin: 11.2 g/dL — ABNORMAL LOW (ref 12.0–15.0)
Potassium: 3.8 mmol/L (ref 3.5–5.1)
Sodium: 139 mmol/L (ref 135–145)
TCO2: 23 mmol/L (ref 0–100)

## 2016-06-10 LAB — URINE MICROSCOPIC-ADD ON

## 2016-06-10 LAB — POCT PREGNANCY, URINE: PREG TEST UR: NEGATIVE

## 2016-06-10 MED ORDER — DICYCLOMINE HCL 20 MG PO TABS: 20.0000 mg | ORAL_TABLET | Freq: Two times a day (BID) | ORAL | 0 refills | Status: DC | PRN

## 2016-06-10 MED ORDER — ONDANSETRON HCL 4 MG/2ML IJ SOLN
4.0000 mg | Freq: Once | INTRAMUSCULAR | Status: AC
  Administered 2016-06-10: 4 mg via INTRAVENOUS
  Filled 2016-06-10: qty 2

## 2016-06-10 MED ORDER — DICYCLOMINE HCL 10 MG/ML IM SOLN
20.0000 mg | Freq: Once | INTRAMUSCULAR | Status: DC
  Filled 2016-06-10: qty 2

## 2016-06-10 MED ORDER — SODIUM CHLORIDE 0.9 % IV BOLUS (SEPSIS)
1000.0000 mL | Freq: Once | INTRAVENOUS | Status: AC
  Administered 2016-06-10: 1000 mL via INTRAVENOUS

## 2016-06-10 MED ORDER — METOCLOPRAMIDE HCL 5 MG/ML IJ SOLN
10.0000 mg | Freq: Once | INTRAMUSCULAR | Status: AC
  Administered 2016-06-10: 10 mg via INTRAVENOUS
  Filled 2016-06-10: qty 2

## 2016-06-10 MED ORDER — METOCLOPRAMIDE HCL 10 MG PO TABS: 10.0000 mg | ORAL_TABLET | Freq: Four times a day (QID) | ORAL | 0 refills | Status: DC

## 2016-06-10 MED ORDER — FLEET ENEMA 7-19 GM/118ML RE ENEM: 1.0000 | ENEMA | Freq: Once | RECTAL | 0 refills | Status: AC

## 2016-06-10 NOTE — MAU Provider Note (Signed)
MAU HISTORY AND PHYSICAL  Chief Complaint:  Nausea and Emesis   Lori Andrews is a 40 y.o. Non-pregnant  female presenting for Nausea and Emesis  Has been having nausea, vomiting, decreased appetite for 2.5 weeks, has not been able to keep anything down. Has seen 2 different providers, being treated for cystitis and kidney stones currently with ciprofloxacin and pain medicine. Does not feel improved, contacted urgent care doctor who saw her last and was recommended to come to ED.   Currently reporting fevers, chills, flank pain, dysuria, and fatigue.  Past Medical History:  Diagnosis Date  . Anemia   . Chlamydia    Over ten years ago  . Complication of anesthesia    Difficulty waking up from anesthetic  . Endometriosis 02/29/2012  . Heart murmur    occ rapid beats- pt thinks related to caffeine use  . Pelvic pain in female 02/29/2012  . Trichimoniasis    Over 10 years ago    Past Surgical History:  Procedure Laterality Date  . BREAST LUMPECTOMY    . BREAST SURGERY     Lump removed at 40 yo  . DILATION AND CURETTAGE OF UTERUS  02/29/2012   Procedure: DILATATION AND CURETTAGE;  Surgeon: Elveria Royals, MD;  Location: [facility];  Service: Gynecology;  Laterality: N/A;  . ENDOMETRIAL ABLATION  02/29/2012   Procedure: ENDOMETRIAL ABLATION;  Surgeon: Elveria Royals, MD;  Location: [facility];  Service: Gynecology;  Laterality: N/A;  . LAPAROSCOPY  02/29/2012   Procedure: LAPAROSCOPY OPERATIVE;  Surgeon: Elveria Royals, MD;  Location: [facility];  Service: Gynecology;  Laterality: N/A;  w/ Chromopertubation    Family History  Problem Relation Age of Onset  . Diabetes Mother   . Diabetes Father   . Hyperlipidemia Father   . Diabetes Sister     Social History  Substance Use Topics  . Smoking status: Never Smoker  . Smokeless tobacco: Never Used  . Alcohol use Yes     Comment: Occasionallly     Allergies  Allergen Reactions  . Codeine Itching    Says she can take hydrocodone  w/o problem    . Red Dye Itching    Prescriptions Prior to Admission  Medication Sig Dispense Refill Last Dose  . ciprofloxacin (CIPRO) 500 MG tablet Take 500 mg by mouth 2 (two) times daily.   Past Week at Unknown time  . HYDROcodone-acetaminophen (NORCO/VICODIN) 5-325 MG tablet Take 1 tablet by mouth every 6 (six) hours as needed for moderate pain.     . IRON PO Take 1 tablet by mouth as needed (low iron).   Past Month at Unknown time  . ondansetron (ZOFRAN-ODT) 4 MG disintegrating tablet Take 4 mg by mouth every 8 (eight) hours as needed for nausea or vomiting.   06/09/2016 at Unknown time  . oxyCODONE-acetaminophen (PERCOCET/ROXICET) 5-325 MG tablet Take by mouth every 4 (four) hours as needed for severe pain.   06/09/2016 at Unknown time  . Levonorgestrel-Ethinyl Estradiol (AMETHIA,CAMRESE) 0.1-0.02 & 0.01 MG tablet Take 1 tablet by mouth daily. 1 Package 4   . Levonorgestrel-Ethinyl Estradiol (AMETHIA,CAMRESE) 0.15-0.03 &0.01 MG tablet Take 1 tablet by mouth daily. (Patient not taking: Reported on 04/30/2016) 1 Package 4 Not Taking    Review of Systems - Negative except for what is mentioned in HPI.  Physical Exam  Blood pressure 147/94, pulse 82, temperature 98.2 F (36.8 C), temperature source Oral, resp. rate 18, height 5\' 3"  (1.6 m), weight 88.3 kg (194 lb  9.6 oz), last menstrual period 06/07/2016, SpO2 97 %. GENERAL: Well-developed, well-nourished female in no acute distress.  LUNGS: No respiratory distress HEART: Regular rate ABDOMEN: Soft, nontender, nondistended EXTREMITIES: Nontender, no edema, 2+ distal pulses.  Labs: Results for orders placed or performed during the hospital encounter of 06/10/16 (from the past 24 hour(s))  Urinalysis, Routine w reflex microscopic (not at [facility])   Collection Time: 06/10/16  1:30 AM  Result Value Ref Range   Color, Urine YELLOW YELLOW   APPearance CLOUDY (A) CLEAR   Specific Gravity, Urine 1.020 1.005 - 1.030   pH 5.5 5.0 - 8.0    Glucose, UA NEGATIVE NEGATIVE mg/dL   Hgb urine dipstick LARGE (A) NEGATIVE   Bilirubin Urine NEGATIVE NEGATIVE   Ketones, ur NEGATIVE NEGATIVE mg/dL   Protein, ur 30 (A) NEGATIVE mg/dL   Nitrite NEGATIVE NEGATIVE   Leukocytes, UA SMALL (A) NEGATIVE  Urine microscopic-add on   Collection Time: 06/10/16  1:30 AM  Result Value Ref Range   Squamous Epithelial / LPF 0-5 (A) NONE SEEN   WBC, UA 0-5 0 - 5 WBC/hpf   RBC / HPF TOO NUMEROUS TO COUNT 0 - 5 RBC/hpf   Bacteria, UA FEW (A) NONE SEEN   Urine-Other MUCOUS PRESENT   Pregnancy, urine POC   Collection Time: 06/10/16  1:43 AM  Result Value Ref Range   Preg Test, Ur NEGATIVE NEGATIVE    Imaging Studies:  No results found.  Assessment: Lori Andrews is  40 y.o. G0P0000 at Unknown presents with Nausea and Emesis . MDM U/A Patient stable for transfer, no ob/gyn conditions that could be addressed in MAU  Plan:  Spoke with ED provider at [facility], Dr. Betsey Holiday, who recommended patient be seen in [facility] for evaluation Patient transfer to [facility] ED, offered [facility] transport but patient declined and chose to use private vehicle for transportation Patient verbalized agreement with this plan  Steve Rattler, DO PGY-1 10/29/20173:03 AM   I was present for the exam and agree with above. Discussed history, exam, labs with Dr. Roselie Awkward who agrees with plan to transfer patient to [facility] ED. Patient in no apparent distress, non-toxic-appearing. Appropriate for transfer by private vehicle.  [place], [place] 06/10/2016 4:09 AM

## 2016-06-10 NOTE — ED Provider Notes (Signed)
Care assumed from previous provider PA Carlota Raspberry. Please see note for further details. Briefly, patient is a 40 y.o. female with lower abdominal pain, n/v, back pain who is currently undergoing treatment for UTI. Seen at [facility] hospital earlier and cleared from obgyn perspective. [facility] and [facility] by me. Small leuks and 0-5 wbc's - infxn seems to be improving from prior UA. Did have large amount of hgb. Case discussed, plan agreed upon. Will follow up chem-8 and CT abdomen.   Chem-8 reviewed: elevated creatinine at 1.7  CT negative. There is moderate stool throughout the colon. Repeat abdominal exam with nonsurgical abdomen. She does exhibit tenderness along left upper and lower abdomen. Upon further discussion with patient, she states that other than one "pebble-sized" BM, she has not had a bowel movement in over a week. She states that she was started on Norco as well as Cipro for UTI on 10/23. Appears that pain medication may be contributing to constipation. Offered mag for constipation but patient does not believe she can keep this medication down 2/2 n/v. Offered enema in ED vs. Home and patient prefers home.   No episodes of emesis since I have seemed care. Patient ate [place] toast with no episodes of emesis. She is still complaining of pain and requesting narcotic pain medication. I again discussed that narcotic pain medication worsens constipation. I discussed her normal CT abdomen and I do not believe narcotic pain medication is warranted at this time. I offered Bentyl for her symptoms which she declined. Patient is nontoxic, nonseptic appearing, in no apparent distress. Patient's pain and other symptoms adequately managed in emergency department. Fluid bolus x2 given.  Patient does not meet the SIRS or Sepsis criteria. No indication of appendicitis, bowel obstruction, bowel perforation, cholecystitis, diverticulitis, PID or ectopic pregnancy.  Patient states that if she does not feel  better that she will "just come back here for something for pain". I discussed reasons to return immediately to the ER. Patient discharged home with symptomatic treatment and given strict instructions for follow-up with their primary care physician.   Blood pressure 154/89, pulse 89, temperature 97.8 F (36.6 C), temperature source Oral, resp. rate 16, height 5\' 3"  (1.6 m), weight 88 kg, last menstrual period 06/07/2016, SpO2 99 %.    Lori Almond Georgiana Spillane, PA-C 06/10/16 [place], MD 06/10/16 [phone]

## 2016-06-10 NOTE — ED Triage Notes (Signed)
Patient presents from [facility] with c/o lower back pain and lower abd pain with N/V, pain.  Family member did all the talking because the patient stated she hurt to bad to talk

## 2016-06-10 NOTE — ED Provider Notes (Signed)
[facility] DEPT Provider Note   CSN: [id number] Arrival date & time: 06/10/16  0358  History   Chief Complaint Chief Complaint  Patient presents with  . Nephrolithiasis   HPI Lori Andrews is a 40 y.o. female.  HPI  [facility] she has been having lower abdominal and back pain with N/V. They have cleared her from an Ob/Gyn perspective and were concern with kidney stones. The patient on arrival is in a significant amount of pain and due to hemoglobin in her urine feel that she is likely passing a stone. The [facility] which negative for infection that may cause patients symptoms. Per MAU note the patient has been having decreased appetitie for the past 2.5 weeks, not kept anything down and is being treated for cystitis, currently on cipro.  Past Medical History:  Diagnosis Date  . Anemia   . Chlamydia    Over ten years ago  . Complication of anesthesia    Difficulty waking up from anesthetic  . Endometriosis 02/29/2012  . Heart murmur    occ rapid beats- pt thinks related to caffeine use  . Pelvic pain in female 02/29/2012  . Trichimoniasis    Over 10 years ago    Patient Active Problem List   Diagnosis Date Noted  . Fibroids, intramural 03/26/2016  . Fe deficiency anemia 09/01/2012  . Endometriosis 02/29/2012  . Pelvic pain in female 02/29/2012    Past Surgical History:  Procedure Laterality Date  . BREAST LUMPECTOMY    . BREAST SURGERY     Lump removed at 39 yo  . DILATION AND CURETTAGE OF UTERUS  02/29/2012   Procedure: DILATATION AND CURETTAGE;  Surgeon: Elveria Royals, MD;  Location: [facility];  Service: Gynecology;  Laterality: N/A;  . ENDOMETRIAL ABLATION  02/29/2012   Procedure: ENDOMETRIAL ABLATION;  Surgeon: Elveria Royals, MD;  Location: [facility];  Service: Gynecology;  Laterality: N/A;  . LAPAROSCOPY  02/29/2012   Procedure: LAPAROSCOPY OPERATIVE;  Surgeon: Elveria Royals, MD;  Location: [facility];   Service: Gynecology;  Laterality: N/A;  w/ Chromopertubation    OB History    Gravida Para Term Preterm AB Living   0 0 0 0 0 0   SAB TAB Ectopic Multiple Live Births   0 0 0 0         Home Medications    Prior to Admission medications   Medication Sig Start Date End Date Taking? Authorizing Provider  ciprofloxacin (CIPRO) 500 MG tablet Take 500 mg by mouth 2 (two) times daily.    Historical Provider, MD  HYDROcodone-acetaminophen (NORCO/VICODIN) 5-325 MG tablet Take 1 tablet by mouth every 6 (six) hours as needed for moderate pain.    Historical Provider, MD  IRON PO Take 1 tablet by mouth as needed (low iron).    Historical Provider, MD  Levonorgestrel-Ethinyl Estradiol (AMETHIA,CAMRESE) 0.1-0.02 & 0.01 MG tablet Take 1 tablet by mouth daily. 04/30/16   Emily Filbert, MD  Levonorgestrel-Ethinyl Estradiol (AMETHIA,CAMRESE) 0.15-0.03 &0.01 MG tablet Take 1 tablet by mouth daily. Patient not taking: Reported on 04/30/2016 03/26/16   Emily Filbert, MD  ondansetron (ZOFRAN-ODT) 4 MG disintegrating tablet Take 4 mg by mouth every 8 (eight) hours as needed for nausea or vomiting.    Historical Provider, MD  oxyCODONE-acetaminophen (PERCOCET/ROXICET) 5-325 MG tablet Take by mouth every 4 (four) hours as needed for severe pain.    Historical Provider, MD  Family History Family History  Problem Relation Age of Onset  . Diabetes Mother   . Diabetes Father   . Hyperlipidemia Father   . Diabetes Sister     Social History Social History  Substance Use Topics  . Smoking status: Never Smoker  . Smokeless tobacco: Never Used  . Alcohol use Yes     Comment: Occasionallly      Allergies   Codeine and Red dye   Review of Systems Review of Systems  Review of Systems All other systems negative except as documented in the HPI. All pertinent positives and negatives as reviewed in the HPI.  Physical Exam Updated Vital Signs BP 150/86 (BP Location: Right Arm)   Pulse 91   Temp 98.5 F  (36.9 C) (Oral)   Resp 18   Ht 5\' 3"  (1.6 m)   Wt 88 kg   LMP 06/07/2016   SpO2 97%   BMI 34.37 kg/m   Physical Exam  Constitutional: She appears well-developed and well-nourished. She appears distressed (pain).  HENT:  Head: Normocephalic and atraumatic.  Eyes: Conjunctivae are normal. Pupils are equal, round, and reactive to light.  Neck: Trachea normal, normal range of motion and full passive range of motion without pain. Neck supple.  Cardiovascular: Normal rate, regular rhythm and normal pulses.   Pulmonary/Chest: Effort normal and breath sounds normal. Chest wall is not dull to percussion. She exhibits no tenderness, no crepitus, no edema, no deformity and no retraction.  Abdominal: Soft. Normal appearance and bowel sounds are normal. She exhibits no distension. There is tenderness. There is CVA tenderness. There is no rigidity, no rebound and no guarding.  Musculoskeletal: Normal range of motion.  Neurological: She is alert. She has normal strength.  Skin: Skin is warm, dry and intact.  Psychiatric: She has a normal mood and affect. Her speech is normal and behavior is normal. Judgment and thought content normal. Cognition and memory are normal.     ED Treatments / Results  Labs (all labs ordered are listed, but only abnormal results are displayed) Labs Reviewed - No data to display  EKG  EKG Interpretation None       Radiology No results found.  Procedures Procedures (including critical care time)  Medications Ordered in ED Medications - No data to display   Initial Impression / Assessment and Plan / ED Course  I have reviewed the triage vital signs and the nursing notes.  Pertinent labs & imaging results that were available during my care of the patient were reviewed by me and considered in my medical decision making (see chart for details).  Clinical Course   At end of shift patient sign out to [facility], PA-C. The patient has had a CT scan of the  abd/pelv which has not been read. She is currently receiving pain medication, Zofran and fluids for rehydration. Chem 8 pending. She endorses not being able to keep any food down. Prior to discharge will need a fluid challenge.  Final Clinical Impressions(s) / ED Diagnoses   Final diagnoses:  None    New Prescriptions New Prescriptions   No medications on file     Delos Haring, Hershal Coria 06/10/16 [place], MD 06/10/16 [phone]

## 2016-06-10 NOTE — Discharge Instructions (Signed)
Please follow up with your primary care physician for discussion of today's diagnosis. You had a CT abdomen pelvis with contrast that was negative. Use prescription for enema or buy over the counter enema for help with constipation. Take stool softener daily until bowel movements are regular. Reglan as needed for nausea. Bentyl as needed for abdominal pain. As discussed, narcotic pain medication such as Norco/Percocet can contribute to worsening constipation and abdominal pain.   Please seek immediate care if you develop any of the following symptoms: The pain does not go away.  You have a fever.  You keep throwing up (vomiting). You pass bloody or black tarry stools.  There is bright red blood in the stool.  You do not have a bowel movement in the next two days.  There is rectal pain.  You do not seem to be getting better.  You have any questions or concerns.

## 2016-06-10 NOTE — ED Notes (Signed)
PA made aware that pt took her home percocet after being advised not to. Pt states that she was able to eat "a few bites" of french toast and apple juice.

## 2016-06-10 NOTE — ED Notes (Signed)
Ward PA-C notified of continued pain, to see patient prior to orders.

## 2016-06-10 NOTE — Progress Notes (Signed)
Pt going to [facility] ED by private vehicle. PT has significant other with her

## 2016-06-10 NOTE — ED Notes (Signed)
Family at bedside. 

## 2016-06-10 NOTE — MAU Note (Signed)
Been to doctor 3 times and being treated for uti and have kidney stones. Vomiting off and on for 8 days and no appetite. Nausea medicine not helping. Was seen at [facility] in [place] and another [facility] in [place]. Chest tight when breathe and salivate a lot. Lower back and abd pain.

## 2016-06-13 ENCOUNTER — Ambulatory Visit (INDEPENDENT_AMBULATORY_CARE_PROVIDER_SITE_OTHER): Payer: BLUE CROSS/BLUE SHIELD | Admitting: Obstetrics & Gynecology

## 2016-06-13 ENCOUNTER — Encounter: Payer: Self-pay | Admitting: Obstetrics & Gynecology

## 2016-06-13 VITALS — BP 148/87 | HR 82 | Ht 63.0 in | Wt 186.0 lb

## 2016-06-13 DIAGNOSIS — N939 Abnormal uterine and vaginal bleeding, unspecified: Secondary | ICD-10-CM

## 2016-06-13 DIAGNOSIS — R102 Pelvic and perineal pain: Secondary | ICD-10-CM | POA: Diagnosis not present

## 2016-06-13 MED ORDER — MEGESTROL ACETATE 40 MG PO TABS: 40.0000 mg | ORAL_TABLET | Freq: Two times a day (BID) | ORAL | 2 refills | Status: DC

## 2016-06-13 MED ORDER — MISOPROSTOL 200 MCG PO TABS: ORAL_TABLET | ORAL | 0 refills | Status: DC

## 2016-06-13 MED ORDER — DICLOFENAC POTASSIUM 50 MG PO TABS: 50.0000 mg | ORAL_TABLET | Freq: Three times a day (TID) | ORAL | 0 refills | Status: DC

## 2016-06-13 NOTE — Patient Instructions (Signed)
Levonorgestrel intrauterine device (IUD) What is this medicine? LEVONORGESTREL IUD (LEE voe nor jes trel) is a contraceptive (birth control) device. The device is placed inside the uterus by a healthcare professional. It is used to prevent pregnancy and can also be used to treat heavy bleeding that occurs during your period. Depending on the device, it can be used for 3 to 5 years. This medicine may be used for other purposes; ask your health care provider or pharmacist if you have questions. What should I tell my health care provider before I take this medicine? They need to know if you have any of these conditions: -abnormal Pap smear -cancer of the breast, uterus, or cervix -diabetes -endometritis -genital or pelvic infection now or in the past -have more than one sexual partner or your partner has more than one partner -heart disease -history of an ectopic or tubal pregnancy -immune system problems -IUD in place -liver disease or tumor -problems with blood clots or take blood-thinners -use intravenous drugs -uterus of unusual shape -vaginal bleeding that has not been explained -an unusual or allergic reaction to levonorgestrel, other hormones, silicone, or polyethylene, medicines, foods, dyes, or preservatives -pregnant or trying to get pregnant -breast-feeding How should I use this medicine? This device is placed inside the uterus by a health care professional. Talk to your pediatrician regarding the use of this medicine in children. Special care may be needed. Overdosage: If you think you have taken too much of this medicine contact a poison control center or emergency room at once. NOTE: This medicine is only for you. Do not share this medicine with others. What if I miss a dose? This does not apply. What may interact with this medicine? Do not take this medicine with any of the following medications: -amprenavir -bosentan -fosamprenavir This medicine may also interact with  the following medications: -aprepitant -barbiturate medicines for inducing sleep or treating seizures -bexarotene -griseofulvin -medicines to treat seizures like carbamazepine, ethotoin, felbamate, oxcarbazepine, phenytoin, topiramate -modafinil -pioglitazone -rifabutin -rifampin -rifapentine -some medicines to treat HIV infection like atazanavir, indinavir, lopinavir, nelfinavir, tipranavir, ritonavir -St. John's wort -warfarin This list may not describe all possible interactions. Give your health care provider a list of all the medicines, herbs, non-prescription drugs, or dietary supplements you use. Also tell them if you smoke, drink alcohol, or use illegal drugs. Some items may interact with your medicine. What should I watch for while using this medicine? Visit your doctor or health care professional for regular check ups. See your doctor if you or your partner has sexual contact with others, becomes HIV positive, or gets a sexual transmitted disease. This product does not protect you against HIV infection (AIDS) or other sexually transmitted diseases. You can check the placement of the IUD yourself by reaching up to the top of your vagina with clean fingers to feel the threads. Do not pull on the threads. It is a good habit to check placement after each menstrual period. Call your doctor right away if you feel more of the IUD than just the threads or if you cannot feel the threads at all. The IUD may come out by itself. You may become pregnant if the device comes out. If you notice that the IUD has come out use a backup birth control method like condoms and call your health care provider. Using tampons will not change the position of the IUD and are okay to use during your period. What side effects may I notice from receiving this medicine?   Side effects that you should report to your doctor or health care professional as soon as possible: -allergic reactions like skin rash, itching or  hives, swelling of the face, lips, or tongue -fever, flu-like symptoms -genital sores -high blood pressure -no menstrual period for 6 weeks during use -pain, swelling, warmth in the leg -pelvic pain or tenderness -severe or sudden headache -signs of pregnancy -stomach cramping -sudden shortness of breath -trouble with balance, talking, or walking -unusual vaginal bleeding, discharge -yellowing of the eyes or skin Side effects that usually do not require medical attention (report to your doctor or health care professional if they continue or are bothersome): -acne -breast pain -change in sex drive or performance -changes in weight -cramping, dizziness, or faintness while the device is being inserted -headache -irregular menstrual bleeding within first 3 to 6 months of use -nausea This list may not describe all possible side effects. Call your doctor for medical advice about side effects. You may report side effects to FDA at 1-800-FDA-1088. Where should I keep my medicine? This does not apply. NOTE: This sheet is a summary. It may not cover all possible information. If you have questions about this medicine, talk to your doctor, pharmacist, or health care provider.    2016, [employer]vier/Gold Standard. (2011-08-30 13:54:04)  

## 2016-06-13 NOTE — Progress Notes (Signed)
Pt wants something to stop her periods. Her pain happens when she starts to have periods.

## 2016-06-13 NOTE — Progress Notes (Signed)
History:  40 y.o. G0P0000 here today for reeval of AUB assoc with pelvic pain. Pt reports that she was on OCPs but, did not like the side effects.   She was on th e3 months pill but, she reports that her bleeding was worse.  She has a known h/o endometriosis and she reports that the pain occurs only hen she is on her menses. She can feel when a clot is passing and is ready to pass.  She reports relief prev from Ketoprofen that was given to her from Dr. Benjie Karvonen.    The following portions of the patient's history were reviewed and updated as appropriate: allergies, current medications, past family history, past medical history, past social history, past surgical history and problem list.  Review of Systems:  Pertinent items are noted in HPI.  Objective:  Physical Exam Blood pressure (!) 148/87, pulse 82, height 5\' 3"  (1.6 m), weight 186 lb (84.4 kg), last menstrual period 06/07/2016. Gen: NAD Lung: CTA CV: RRR  Abd: Soft, nontender and nondistended; obese Pelvic: deferred  Labs and Imaging Ct Abdomen Pelvis Wo Contrast  Result Date: 06/10/2016 CLINICAL DATA:  40 year old female with left-sided flank pain. EXAM: CT ABDOMEN AND PELVIS WITHOUT CONTRAST TECHNIQUE: Multidetector CT imaging of the abdomen and pelvis was performed following the standard protocol without IV contrast. COMPARISON:  CT dated 02/15/2015 FINDINGS: Evaluation of this exam is limited in the absence of intravenous contrast. Lower chest: The visualized lung bases are clear. No intra-abdominal free air or free fluid. Hepatobiliary: No focal liver abnormality is seen. No gallstones, gallbladder wall thickening, or biliary dilatation. Pancreas: Unremarkable. No pancreatic ductal dilatation or surrounding inflammatory changes. Spleen: Normal in size without focal abnormality. Adrenals/Urinary Tract: The adrenal glands appear unremarkable. Punctate nonobstructing left renal upper pole calculus may be present. There is no hydronephrosis or  obstructing stone. The right kidney appears unremarkable. The visualized ureters and urinary bladder appear unremarkable as well. Stomach/Bowel: There is moderate stool throughout the colon. Small scattered colonic diverticula primary in the proximal colon without active inflammatory changes. There is no evidence of bowel obstruction or active inflammation. Normal appendix. Vascular/Lymphatic: The abdominal aorta and IVC appear unremarkable on this noncontrast study. No portal venous gas identified. There is no adenopathy. Reproductive: Small uterine fibroids better visualized on the prior contrast-enhanced CT. There is slight prominence of the upper vagina and cervical region. This may be artifactual or related to fluid within the vagina. Ultrasound may provide better evaluation of the pelvic structures. The ovaries appear unremarkable. Other: None Musculoskeletal: No acute or significant osseous findings. IMPRESSION: No acute intra-abdominal pelvic pathology. No hydronephrosis or obstructing stone. Electronically Signed   By: Anner Crete M.D.   On: 06/10/2016 06:44    Assessment & Plan:  AUB and pelvic pain  Megace 80mg  bid x 4d then 40mg  bid Discussed with pt LnIUD- she is interested but, will consider it once her pain is improved. Cataflam 50 mg tid prn pain Pt notified NOT to take other NSAIDS while on the Cataflam   Stillman Buenger L. Harraway-Smith, M.D., Cherlynn June

## 2016-06-15 ENCOUNTER — Encounter (HOSPITAL_COMMUNITY): Payer: Self-pay

## 2016-06-15 ENCOUNTER — Inpatient Hospital Stay (HOSPITAL_COMMUNITY)
Admission: AD | Admit: 2016-06-15 | Discharge: 2016-06-16 | Disposition: A | Discharge status: Home / Self Care | Payer: BLUE CROSS/BLUE SHIELD | Source: Ambulatory Visit | Attending: Obstetrics and Gynecology | Admitting: Obstetrics and Gynecology

## 2016-06-15 DIAGNOSIS — K5900 Constipation, unspecified: Secondary | ICD-10-CM | POA: Diagnosis not present

## 2016-06-15 DIAGNOSIS — N939 Abnormal uterine and vaginal bleeding, unspecified: Secondary | ICD-10-CM | POA: Insufficient documentation

## 2016-06-15 DIAGNOSIS — R102 Pelvic and perineal pain: Secondary | ICD-10-CM | POA: Insufficient documentation

## 2016-06-15 DIAGNOSIS — G8929 Other chronic pain: Secondary | ICD-10-CM | POA: Diagnosis not present

## 2016-06-15 DIAGNOSIS — R109 Unspecified abdominal pain: Secondary | ICD-10-CM | POA: Diagnosis present

## 2016-06-15 DIAGNOSIS — N809 Endometriosis, unspecified: Secondary | ICD-10-CM

## 2016-06-15 HISTORY — DX: Unspecified ovarian cyst, unspecified side: N83.209

## 2016-06-15 MED ORDER — PROMETHAZINE HCL 25 MG/ML IJ SOLN
25.0000 mg | Freq: Once | INTRAMUSCULAR | Status: AC
Start: 2016-06-15 — End: 2016-06-15
  Administered 2016-06-15: 25 mg via INTRAMUSCULAR
  Filled 2016-06-15: qty 1

## 2016-06-15 MED ORDER — KETOROLAC TROMETHAMINE 60 MG/2ML IM SOLN
30.0000 mg | Freq: Once | INTRAMUSCULAR | Status: AC
  Administered 2016-06-15: 30 mg via INTRAMUSCULAR
  Filled 2016-06-15: qty 2

## 2016-06-15 MED ORDER — HYDROMORPHONE HCL 1 MG/ML IJ SOLN
1.0000 mg | Freq: Once | INTRAMUSCULAR | Status: AC
  Administered 2016-06-15: 1 mg via INTRAMUSCULAR
  Filled 2016-06-15: qty 1

## 2016-06-15 NOTE — MAU Provider Note (Addendum)
History     CSN: [id number]  Arrival date and time: 06/15/16 1813   None     Chief Complaint  Patient presents with  . Abdominal Pain   HPI: Pt presents to MAU via with c/o abd pain. This appears from review of pt's medical record to be a chronic condition. She has been seen in various ER's and MD offices for this c/o plus AUB, back pain, and N/V.  She has had an exstentive work including CT scan and U/S. Her OB/GYN started her on Megace for the bleeding. She has received various pain medications as well.  She has had problems with constipation and had little results from Magnesium citrate.  She has taken OxyContin for pain today.   She has a H/O endometriosis. Her past surgerical history is significant for ablation of endometriosis implants and LOA in 2013.     Past Medical History:  Diagnosis Date  . Anemia   . Chlamydia    Over ten years ago  . Complication of anesthesia    Difficulty waking up from anesthetic  . Endometriosis 02/29/2012  . Heart murmur    occ rapid beats- pt thinks related to caffeine use  . Ovarian cyst   . Pelvic pain in female 02/29/2012  . Trichimoniasis    Over 10 years ago    Past Surgical History:  Procedure Laterality Date  . BREAST LUMPECTOMY    . BREAST SURGERY     Lump removed at 40 yo  . DILATION AND CURETTAGE OF UTERUS  02/29/2012   Procedure: DILATATION AND CURETTAGE;  Surgeon: Elveria Royals, MD;  Location: [facility];  Service: Gynecology;  Laterality: N/A;  . ENDOMETRIAL ABLATION  02/29/2012   Procedure: ENDOMETRIAL ABLATION;  Surgeon: Elveria Royals, MD;  Location: [facility];  Service: Gynecology;  Laterality: N/A;  . LAPAROSCOPY  02/29/2012   Procedure: LAPAROSCOPY OPERATIVE;  Surgeon: Elveria Royals, MD;  Location: [facility];  Service: Gynecology;  Laterality: N/A;  w/ Chromopertubation    Family History  Problem Relation Age of Onset  . Diabetes Mother   . Diabetes Father   . Hyperlipidemia Father   . Diabetes Sister      Social History  Substance Use Topics  . Smoking status: Never Smoker  . Smokeless tobacco: Never Used  . Alcohol use Yes     Comment: Occasionallly     Allergies:  Allergies  Allergen Reactions  . Codeine Itching  . Red Dye Itching    Prescriptions Prior to Admission  Medication Sig Dispense Refill Last Dose  . diclofenac (CATAFLAM) 50 MG tablet Take 50 mg by mouth 3 (three) times daily as needed (for pain).   Past Week at Unknown time  . dicyclomine (BENTYL) 20 MG tablet Take 1 tablet (20 mg total) by mouth 2 (two) times daily as needed for spasms. 20 tablet 0 Past Month at Unknown time  . HYDROcodone-acetaminophen (NORCO/VICODIN) 5-325 MG tablet Take 2 tablets by mouth every 6 (six) hours as needed for moderate pain.   06/15/2016 at 1530  . ibuprofen (ADVIL,MOTRIN) 800 MG tablet Take 800 mg by mouth every 8 (eight) hours as needed for mild pain or moderate pain.   06/15/2016 at 0930  . metoCLOPramide (REGLAN) 10 MG tablet Take 10 mg by mouth every 6 (six) hours as needed for nausea.   Past Month at Unknown time  . ondansetron (ZOFRAN-ODT) 4 MG disintegrating tablet Take 4 mg by mouth every 8 (eight) hours as needed for  nausea or vomiting.   Past Week at Unknown time  . Oxycodone HCl 10 MG TABS Take 10 mg by mouth every 6 (six) hours as needed (for pain).   06/15/2016 at 0930    ROS Physical Exam   Blood pressure 156/78, pulse 83, temperature 98.4 F (36.9 C), resp. rate 20, last menstrual period 06/07/2016.  Physical Exam  Uncomfortable appearing female but in NAD Lungs clear Heart RRR Abd soft + BS diffuse tenderness no rebound or guarding Pelvic deferred MAU Course  Procedures Enema  Pt feels some better after IM Toradol and Phenergan. Assessment and Plan  Chronic abd/pelvic pain AUB H/O Endometriosis  Pt instructed to continue with pain medications that have previously been prescribed. Continue with Megace as prescribed by OB/GYN Increase water intake. Stool  Softener daily. Keep F/U appt with OB/GYN   Chancy Milroy 06/15/2016, 7:32 PM   Got mostly water out with enema, states some relief  [facility] 06/04/16  20 tabs of Hydrocodone 06/07/16  15 tabs of oxycodone  No other meds listed  Still has 4 Oxy's in her bottle  Wants to be admitted until Tuesday when she can go to [facility]. Discussed there is no medical reason for admission and she states "for pain control".  Discussed we cannot admit her for that  Long discussion of alternating NSAIDs and narcotics. Discussed tolerance.  Per Dr Rip Harbour, will give one shot of Dilaudid prior to discharge home Patient wants to wait and see Dr Benjie Karvonen instead of [facility] Encouraged to keep appt as a possible second opinion until she can get appt with Dr Benjie Karvonen  Seabron Spates, CNM

## 2016-06-15 NOTE — MAU Note (Signed)
Having abdominal pain. Was at [facility] yesterday. Was taking hydrocodone 5/325, Oxy 10mg  and Motrin. Motrin was not taken around the clock.  Was taking birth control pills but said then her pain started, so she stopped taking it.

## 2016-06-16 DIAGNOSIS — G8929 Other chronic pain: Secondary | ICD-10-CM | POA: Diagnosis not present

## 2016-06-16 DIAGNOSIS — R102 Pelvic and perineal pain: Secondary | ICD-10-CM

## 2016-06-16 DIAGNOSIS — N809 Endometriosis, unspecified: Secondary | ICD-10-CM | POA: Diagnosis not present

## 2016-06-16 NOTE — Discharge Instructions (Signed)
Constipation, Adult Constipation is when a person has fewer than three bowel movements a week, has difficulty having a bowel movement, or has stools that are dry, hard, or larger than normal. As people grow older, constipation is more common. A low-fiber diet, not taking in enough fluids, and taking certain medicines may make constipation worse.  CAUSES   Certain medicines, such as antidepressants, pain medicine, iron supplements, antacids, and water pills.   Certain diseases, such as diabetes, irritable bowel syndrome (IBS), thyroid disease, or depression.   Not drinking enough water.   Not eating enough fiber-rich foods.   Stress or travel.   Lack of physical activity or exercise.   Ignoring the urge to have a bowel movement.   Using laxatives too much.  SIGNS AND SYMPTOMS   Having fewer than three bowel movements a week.   Straining to have a bowel movement.   Having stools that are hard, dry, or larger than normal.   Feeling full or bloated.   Pain in the lower abdomen.   Not feeling relief after having a bowel movement.  DIAGNOSIS  Your health care provider will take a medical history and perform a physical exam. Further testing may be done for severe constipation. Some tests may include:  A barium enema X-ray to examine your rectum, colon, and, sometimes, your small intestine.   A sigmoidoscopy to examine your lower colon.   A colonoscopy to examine your entire colon. TREATMENT  Treatment will depend on the severity of your constipation and what is causing it. Some dietary treatments include drinking more fluids and eating more fiber-rich foods. Lifestyle treatments may include regular exercise. If these diet and lifestyle recommendations do not help, your health care provider may recommend taking over-the-counter laxative medicines to help you have bowel movements. Prescription medicines may be prescribed if over-the-counter medicines do not work.    HOME CARE INSTRUCTIONS   Eat foods that have a lot of fiber, such as fruits, vegetables, whole grains, and beans.  Limit foods high in fat and processed sugars, such as french fries, hamburgers, cookies, candies, and soda.   A fiber supplement may be added to your diet if you cannot get enough fiber from foods.   Drink enough fluids to keep your urine clear or pale yellow.   Exercise regularly or as directed by your health care provider.   Go to the restroom when you have the urge to go. Do not hold it.   Only take over-the-counter or prescription medicines as directed by your health care provider. Do not take other medicines for constipation without talking to your health care provider first.  Fate IF:   You have bright red blood in your stool.   Your constipation lasts for more than 4 days or gets worse.   You have abdominal or rectal pain.   You have thin, pencil-like stools.   You have unexplained weight loss. MAKE SURE YOU:   Understand these instructions.  Will watch your condition.  Will get help right away if you are not doing well or get worse.   This information is not intended to replace advice given to you by your health care provider. Make sure you discuss any questions you have with your health care provider.   Document Released: 04/27/2004 Document Revised: 08/20/2014 Document Reviewed: 05/11/2013 Elsevier Interactive Patient Education 2016 Elsevier Inc. Pelvic Pain, Female Female pelvic pain can be caused by many different things and start from a variety of places.  Pelvic pain refers to pain that is located in the lower half of the abdomen and between your hips. The pain may occur over a short period of time (acute) or may be reoccurring (chronic). The cause of pelvic pain may be related to disorders affecting the female reproductive organs (gynecologic), but it may also be related to the bladder, kidney stones, an intestinal  complication, or muscle or skeletal problems. Getting help right away for pelvic pain is important, especially if there has been severe, sharp, or a sudden onset of unusual pain. It is also important to get help right away because some types of pelvic pain can be life threatening.  CAUSES  Below are only some of the causes of pelvic pain. The causes of pelvic pain can be in one of several categories.   Gynecologic.  Pelvic inflammatory disease.  Sexually transmitted infection.  Ovarian cyst or a twisted ovarian ligament (ovarian torsion).  Uterine lining that grows outside the uterus (endometriosis).  Fibroids, cysts, or tumors.  Ovulation.  Pregnancy.  Pregnancy that occurs outside the uterus (ectopic pregnancy).  Miscarriage.  Labor.  Abruption of the placenta or ruptured uterus.  Infection.  Uterine infection (endometritis).  Bladder infection.  Diverticulitis.  Miscarriage related to a uterine infection (septic abortion).  Bladder.  Inflammation of the bladder (cystitis).  Kidney stone(s).  Gastrointestinal.  Constipation.  Diverticulitis.  Neurologic.  Trauma.  Feeling pelvic pain because of mental or emotional causes (psychosomatic).  Cancers of the bowel or pelvis. EVALUATION  Your caregiver will want to take a careful history of your concerns. This includes recent changes in your health, a careful gynecologic history of your periods (menses), and a sexual history. Obtaining your family history and medical history is also important. Your caregiver may suggest a pelvic exam. A pelvic exam will help identify the location and severity of the pain. It also helps in the evaluation of which organ system may be involved. In order to identify the cause of the pelvic pain and be properly treated, your caregiver may order tests. These tests may include:   A pregnancy test.  Pelvic ultrasonography.  An X-ray exam of the abdomen.  A urinalysis or  evaluation of vaginal discharge.  Blood tests. HOME CARE INSTRUCTIONS   Only take over-the-counter or prescription medicines for pain, discomfort, or fever as directed by your caregiver.   Rest as directed by your caregiver.   Eat a balanced diet.   Drink enough fluids to make your urine clear or pale yellow, or as directed.   Avoid sexual intercourse if it causes pain.   Apply warm or cold compresses to the lower abdomen depending on which one helps the pain.   Avoid stressful situations.   Keep a journal of your pelvic pain. Write down when it started, where the pain is located, and if there are things that seem to be associated with the pain, such as food or your menstrual cycle.  Follow up with your caregiver as directed.  SEEK MEDICAL CARE IF:  Your medicine does not help your pain.  You have abnormal vaginal discharge. SEEK IMMEDIATE MEDICAL CARE IF:   You have heavy bleeding from the vagina.   Your pelvic pain increases.   You feel light-headed or faint.   You have chills.   You have pain with urination or blood in your urine.   You have uncontrolled diarrhea or vomiting.   You have a fever or persistent symptoms for more than 3 days.  You  have a fever and your symptoms suddenly get worse.   You are being physically or sexually abused.   This information is not intended to replace advice given to you by your health care provider. Make sure you discuss any questions you have with your health care provider.   Document Released: 06/26/2004 Document Revised: 04/20/2015 Document Reviewed: 11/19/2011 Elsevier Interactive Patient Education [employer].

## 2016-07-16 ENCOUNTER — Ambulatory Visit: Payer: BLUE CROSS/BLUE SHIELD | Admitting: Obstetrics & Gynecology

## 2016-08-01 ENCOUNTER — Other Ambulatory Visit: Payer: Self-pay | Admitting: Medical

## 2016-08-11 ENCOUNTER — Encounter (HOSPITAL_COMMUNITY): Payer: Self-pay | Admitting: *Deleted

## 2016-08-11 ENCOUNTER — Ambulatory Visit (HOSPITAL_COMMUNITY)
Admission: EM | Admit: 2016-08-11 | Discharge: 2016-08-11 | Disposition: A | Discharge status: Home / Self Care | Payer: BLUE CROSS/BLUE SHIELD | Attending: Emergency Medicine | Admitting: Emergency Medicine

## 2016-08-11 DIAGNOSIS — J111 Influenza due to unidentified influenza virus with other respiratory manifestations: Secondary | ICD-10-CM

## 2016-08-11 DIAGNOSIS — R69 Illness, unspecified: Secondary | ICD-10-CM

## 2016-08-11 MED ORDER — METHYLPREDNISOLONE SODIUM SUCC 125 MG IJ SOLR
INTRAMUSCULAR | Status: AC
  Filled 2016-08-11: qty 2

## 2016-08-11 MED ORDER — METHYLPREDNISOLONE SODIUM SUCC 125 MG IJ SOLR
80.0000 mg | Freq: Once | INTRAMUSCULAR | Status: AC
  Administered 2016-08-11: 125 mg via INTRAVENOUS

## 2016-08-11 MED ORDER — SODIUM CHLORIDE 0.9 % IV BOLUS (SEPSIS)
1000.0000 mL | Freq: Once | INTRAVENOUS | Status: AC
  Administered 2016-08-11: 1000 mL via INTRAVENOUS

## 2016-08-11 NOTE — Discharge Instructions (Signed)
Continue to push fluids and take over the counter medications as directed on the back of the box for symptomatic relief.  ° °

## 2016-08-11 NOTE — ED Provider Notes (Signed)
CSN: [id number]     Arrival date & time 08/11/16  1612 History   None    Chief Complaint  Patient presents with  . Fever  . Generalized Body Aches  . URI   (Consider location/radiation/quality/duration/timing/severity/associated sxs/prior Treatment) 40 y.o. female presents with generalized aches, fever, cough, headache  X 3 days. Condition is a in nature. Condition is made better by nothing. Condition is made worse by nothing. Patient denies any relief from OTC cough and cold prior to there arrival at this facility. Patient denies any nausea, vomiting, diarrhea. Patient is tachycardiac @ 140 bpm at time of assessment       Past Medical History:  Diagnosis Date  . Anemia   . Chlamydia    Over ten years ago  . Complication of anesthesia    Difficulty waking up from anesthetic  . Endometriosis 02/29/2012  . Heart murmur    occ rapid beats- pt thinks related to caffeine use  . Ovarian cyst   . Pelvic pain in female 02/29/2012  . Trichimoniasis    Over 10 years ago   Past Surgical History:  Procedure Laterality Date  . BREAST LUMPECTOMY    . BREAST SURGERY     Lump removed at 40 yo  . DILATION AND CURETTAGE OF UTERUS  02/29/2012   Procedure: DILATATION AND CURETTAGE;  Surgeon: Elveria Royals, MD;  Location: [facility];  Service: Gynecology;  Laterality: N/A;  . ENDOMETRIAL ABLATION  02/29/2012   Procedure: ENDOMETRIAL ABLATION;  Surgeon: Elveria Royals, MD;  Location: [facility];  Service: Gynecology;  Laterality: N/A;  . LAPAROSCOPY  02/29/2012   Procedure: LAPAROSCOPY OPERATIVE;  Surgeon: Elveria Royals, MD;  Location: [facility];  Service: Gynecology;  Laterality: N/A;  w/ Chromopertubation   Family History  Problem Relation Age of Onset  . Diabetes Mother   . Diabetes Father   . Hyperlipidemia Father   . Diabetes Sister    Social History  Substance Use Topics  . Smoking status: Never Smoker  . Smokeless tobacco: Never Used  . Alcohol use Yes     Comment: Occasionallly     OB History    Gravida Para Term Preterm AB Living   0 0 0 0 0 0   SAB TAB Ectopic Multiple Live Births   0 0 0 0       Review of Systems  Constitutional: Positive for chills, diaphoresis, fatigue and fever.  HENT: Positive for rhinorrhea, sinus pain, sinus pressure and sore throat.   Respiratory: Positive for cough and wheezing.     Allergies  Codeine and Red dye  Home Medications   Prior to Admission medications   Medication Sig Start Date End Date Taking? Authorizing Provider  HYDROcodone-acetaminophen (NORCO/VICODIN) 5-325 MG tablet Take 2 tablets by mouth every 6 (six) hours as needed for moderate pain.   Yes Historical Provider, MD  ketoprofen (ORUDIS) 75 MG capsule Take 75 mg by mouth 4 (four) times daily as needed.   Yes Historical Provider, MD  diclofenac (CATAFLAM) 50 MG tablet Take 50 mg by mouth 3 (three) times daily as needed (for pain).    Historical Provider, MD  dicyclomine (BENTYL) 20 MG tablet Take 1 tablet (20 mg total) by mouth 2 (two) times daily as needed for spasms. 06/10/16   Jaime Pilcher Ward, PA-C  ibuprofen (ADVIL,MOTRIN) 800 MG tablet Take 800 mg by mouth every 8 (eight) hours as needed for mild pain or moderate pain.    Historical Provider, MD  metoCLOPramide (REGLAN) 10 MG tablet Take 10 mg by mouth every 6 (six) hours as needed for nausea.    Historical Provider, MD  ondansetron (ZOFRAN-ODT) 4 MG disintegrating tablet Take 4 mg by mouth every 8 (eight) hours as needed for nausea or vomiting.    Historical Provider, MD  Oxycodone HCl 10 MG TABS Take 10 mg by mouth every 6 (six) hours as needed (for pain).    Historical Provider, MD   Meds Ordered and Administered this Visit   Medications  methylPREDNISolone sodium succinate (SOLU-MEDROL) 125 mg/2 mL injection 80 mg (not administered)  sodium chloride 0.9 % bolus 1,000 mL (1,000 mLs Intravenous Given 08/11/16 1901)    BP 128/81 (BP Location: Left Arm)   Pulse 112   Temp 99 F (37.2 C) (Oral)    Resp 18   SpO2 100%  No data found.   Physical Exam  Constitutional: She is oriented to person, place, and time. She appears well-developed and well-nourished.  HENT:  Head: Normocephalic and atraumatic.  Eyes: Conjunctivae are normal.  Neck: Normal range of motion.  Cardiovascular: Regular rhythm.   tachycardiac  Pulmonary/Chest: Effort normal and breath sounds normal.  Abdominal: Soft.  Musculoskeletal: Normal range of motion.  Neurological: She is alert and oriented to person, place, and time.  Skin: Skin is warm.  moist  Psychiatric: She has a normal mood and affect.  Nursing note and vitals reviewed.   Urgent Care Course   Clinical Course     Procedures (including critical care time)  Labs Review Labs Reviewed - No data to display  Imaging Review No results found.   Heart rate decreased after fluid bolus. Patient reports decrease in symptoms. Patient declines tamiflu   Patient reporting feeling better HR 102 after solumederol adminstration   MDM   1. Influenza-like illness        Jacqualine Mau, NP 08/11/16 2015    Jacqualine Mau, NP 08/11/16 [phone]

## 2016-08-11 NOTE — ED Triage Notes (Signed)
Patient states she thinks she has the flu. Reports high fevers, nasal drainage, cough, and chest congestion. Patient has been taking antipyretics and mucinex around the clock.

## 2016-10-18 ENCOUNTER — Other Ambulatory Visit: Payer: Self-pay | Admitting: Obstetrics & Gynecology

## 2016-10-18 DIAGNOSIS — R9341 Abnormal radiologic findings on diagnostic imaging of renal pelvis, ureter, or bladder: Secondary | ICD-10-CM

## 2016-10-31 ENCOUNTER — Inpatient Hospital Stay
Admission: RE | Admit: 2016-10-31 | Discharge: 2016-10-31 | Disposition: A | Discharge status: Home / Self Care | Payer: BLUE CROSS/BLUE SHIELD | Source: Ambulatory Visit | Attending: Obstetrics & Gynecology | Admitting: Obstetrics & Gynecology

## 2016-10-31 ENCOUNTER — Inpatient Hospital Stay: Admission: RE | Admit: 2016-10-31 | Payer: BLUE CROSS/BLUE SHIELD | Source: Ambulatory Visit

## 2016-11-18 ENCOUNTER — Inpatient Hospital Stay: Admission: RE | Admit: 2016-11-18 | Payer: BLUE CROSS/BLUE SHIELD | Source: Ambulatory Visit

## 2016-11-18 ENCOUNTER — Other Ambulatory Visit: Payer: BLUE CROSS/BLUE SHIELD

## 2016-11-20 ENCOUNTER — Other Ambulatory Visit: Payer: Self-pay | Admitting: Obstetrics & Gynecology

## 2016-11-20 DIAGNOSIS — R9341 Abnormal radiologic findings on diagnostic imaging of renal pelvis, ureter, or bladder: Secondary | ICD-10-CM

## 2016-11-28 ENCOUNTER — Ambulatory Visit
Admission: RE | Admit: 2016-11-28 | Discharge: 2016-11-28 | Disposition: A | Discharge status: Home / Self Care | Payer: BLUE CROSS/BLUE SHIELD | Source: Ambulatory Visit | Attending: Obstetrics & Gynecology | Admitting: Obstetrics & Gynecology

## 2016-11-28 DIAGNOSIS — R9341 Abnormal radiologic findings on diagnostic imaging of renal pelvis, ureter, or bladder: Secondary | ICD-10-CM

## 2016-11-30 ENCOUNTER — Other Ambulatory Visit: Payer: BLUE CROSS/BLUE SHIELD

## 2017-09-30 NOTE — Patient Instructions (Addendum)
Your procedure is scheduled on:  Wednesday, October 16, 2017 at 9:00 am  Enter through the Main Entrance of [facility] at: 7:30 am   Pick up the phone at the desk and dial [phone].  Call this number if you have problems the morning of surgery: [phone].  Remember: Do NOT eat or Do NOT drink clear liquids (including water) after midnight Tuesday  Take these medicines the morning of surgery with a SIP OF WATER: None   Bring inhaler with you on day of surgery.  Do Not smoke on the day of surgery.  Stop herbal medications and supplements at this time.  Do NOT wear jewelry (body piercing), metal hair clips/bobby pins, make-up, or nail polish. Do NOT wear lotions, powders, or perfumes.  You may wear deoderant. Do NOT shave for 48 hours prior to surgery. Do NOT bring valuables to the hospital. Contacts, dentures, or bridgework may not be worn into surgery.  Leave suitcase in car.  After surgery it may be brought to your room.    For patients admitted to the hospital, checkout time is 11:00 AM the day of discharge.

## 2017-10-01 ENCOUNTER — Other Ambulatory Visit: Payer: Self-pay | Admitting: Obstetrics & Gynecology

## 2017-10-08 ENCOUNTER — Encounter (HOSPITAL_COMMUNITY): Payer: Self-pay

## 2017-10-09 ENCOUNTER — Encounter (HOSPITAL_COMMUNITY)
Admission: RE | Admit: 2017-10-09 | Discharge: 2017-10-09 | Disposition: A | Discharge status: Home / Self Care | Payer: BLUE CROSS/BLUE SHIELD | Source: Ambulatory Visit | Attending: Obstetrics & Gynecology | Admitting: Obstetrics & Gynecology

## 2017-10-11 ENCOUNTER — Other Ambulatory Visit: Payer: Self-pay

## 2017-10-11 ENCOUNTER — Encounter (HOSPITAL_COMMUNITY): Payer: Self-pay

## 2017-10-11 ENCOUNTER — Encounter (HOSPITAL_COMMUNITY)
Admission: RE | Admit: 2017-10-11 | Discharge: 2017-10-11 | Disposition: A | Discharge status: Home / Self Care | Payer: BLUE CROSS/BLUE SHIELD | Source: Ambulatory Visit | Attending: Obstetrics & Gynecology | Admitting: Obstetrics & Gynecology

## 2017-10-11 DIAGNOSIS — Z01812 Encounter for preprocedural laboratory examination: Secondary | ICD-10-CM | POA: Diagnosis not present

## 2017-10-11 DIAGNOSIS — N939 Abnormal uterine and vaginal bleeding, unspecified: Secondary | ICD-10-CM | POA: Insufficient documentation

## 2017-10-11 LAB — CBC
HEMATOCRIT: 27.1 % — AB (ref 36.0–46.0)
HEMOGLOBIN: 8.2 g/dL — AB (ref 12.0–15.0)
MCH: 22.1 pg — ABNORMAL LOW (ref 26.0–34.0)
MCHC: 30.3 g/dL (ref 30.0–36.0)
MCV: 73 fL — AB (ref 78.0–100.0)
Platelets: 349 10*3/uL (ref 150–400)
RBC: 3.71 MIL/uL — ABNORMAL LOW (ref 3.87–5.11)
RDW: 15.7 % — ABNORMAL HIGH (ref 11.5–15.5)
WBC: 10.9 10*3/uL — AB (ref 4.0–10.5)

## 2017-10-11 LAB — ABO/RH: ABO/RH(D): O POS

## 2017-10-11 NOTE — Patient Instructions (Signed)
Your procedure is scheduled on:  Wednesday, March 6  Enter through the [employer] of [facility] at: 7 am  Pick up the phone at the desk and dial [phone].  Call this number if you have problems the morning of surgery: [phone].  Remember: Do NOT eat or Do NOT drink clear liquids (including water) after midnight Tuesday  Take these medicines the morning of surgery with a SIP OF WATER:  None  Do NOT wear jewelry (body piercing), metal hair clips/bobby pins, make-up, or nail polish. Do NOT wear lotions, powders, or perfumes.  You may wear deoderant. Do NOT shave for 48 hours prior to surgery. Do NOT bring valuables to the hospital. Contacts may not be worn into surgery.  Leave suitcase in car.  After surgery it may be brought to your room.  For patients admitted to the hospital, checkout time is 11:00 AM the day of discharge. Have a responsible adult drive you home and stay with you for 24 hours after your procedure.  Home with Mother Lori Andrews cell [phone].

## 2017-10-16 ENCOUNTER — Observation Stay (HOSPITAL_COMMUNITY)
Admission: AD | Admit: 2017-10-16 | Discharge: 2017-10-18 | Disposition: A | Discharge status: Home / Self Care | Payer: BLUE CROSS/BLUE SHIELD | Source: Ambulatory Visit | Attending: Obstetrics & Gynecology | Admitting: Obstetrics & Gynecology

## 2017-10-16 ENCOUNTER — Other Ambulatory Visit: Payer: Self-pay

## 2017-10-16 ENCOUNTER — Ambulatory Visit (HOSPITAL_COMMUNITY): Payer: BLUE CROSS/BLUE SHIELD | Admitting: Certified Registered Nurse Anesthetist

## 2017-10-16 ENCOUNTER — Encounter (HOSPITAL_COMMUNITY): Payer: Self-pay

## 2017-10-16 ENCOUNTER — Encounter (HOSPITAL_COMMUNITY)
Admission: AD | Disposition: A | Discharge status: Home / Self Care | Payer: Self-pay | Source: Ambulatory Visit | Attending: Obstetrics & Gynecology

## 2017-10-16 DIAGNOSIS — N994 Postprocedural pelvic peritoneal adhesions: Secondary | ICD-10-CM | POA: Insufficient documentation

## 2017-10-16 DIAGNOSIS — N7011 Chronic salpingitis: Secondary | ICD-10-CM | POA: Diagnosis not present

## 2017-10-16 DIAGNOSIS — Z79891 Long term (current) use of opiate analgesic: Secondary | ICD-10-CM | POA: Insufficient documentation

## 2017-10-16 DIAGNOSIS — Z791 Long term (current) use of non-steroidal anti-inflammatories (NSAID): Secondary | ICD-10-CM | POA: Diagnosis not present

## 2017-10-16 DIAGNOSIS — N800 Endometriosis of uterus: Principal | ICD-10-CM | POA: Insufficient documentation

## 2017-10-16 DIAGNOSIS — Y658 Other specified misadventures during surgical and medical care: Secondary | ICD-10-CM | POA: Insufficient documentation

## 2017-10-16 DIAGNOSIS — N9971 Accidental puncture and laceration of a genitourinary system organ or structure during a genitourinary system procedure: Secondary | ICD-10-CM | POA: Diagnosis not present

## 2017-10-16 DIAGNOSIS — Z79899 Other long term (current) drug therapy: Secondary | ICD-10-CM | POA: Diagnosis not present

## 2017-10-16 DIAGNOSIS — Z9889 Other specified postprocedural states: Secondary | ICD-10-CM

## 2017-10-16 DIAGNOSIS — G8918 Other acute postprocedural pain: Secondary | ICD-10-CM | POA: Diagnosis not present

## 2017-10-16 DIAGNOSIS — Y838 Other surgical procedures as the cause of abnormal reaction of the patient, or of later complication, without mention of misadventure at the time of the procedure: Secondary | ICD-10-CM | POA: Diagnosis not present

## 2017-10-16 DIAGNOSIS — Y92234 Operating room of hospital as the place of occurrence of the external cause: Secondary | ICD-10-CM | POA: Diagnosis not present

## 2017-10-16 DIAGNOSIS — D649 Anemia, unspecified: Secondary | ICD-10-CM | POA: Insufficient documentation

## 2017-10-16 DIAGNOSIS — E282 Polycystic ovarian syndrome: Secondary | ICD-10-CM | POA: Insufficient documentation

## 2017-10-16 DIAGNOSIS — M545 Low back pain: Secondary | ICD-10-CM | POA: Diagnosis not present

## 2017-10-16 DIAGNOSIS — G8929 Other chronic pain: Secondary | ICD-10-CM | POA: Diagnosis present

## 2017-10-16 DIAGNOSIS — R102 Pelvic and perineal pain: Secondary | ICD-10-CM

## 2017-10-16 HISTORY — PX: LAPAROSCOPIC LYSIS OF ADHESIONS: SHX5905

## 2017-10-16 HISTORY — PX: BLADDER REPAIR: SHX6721

## 2017-10-16 HISTORY — PX: CYSTOSCOPY: SHX5120

## 2017-10-16 HISTORY — PX: TOTAL LAPAROSCOPIC HYSTERECTOMY WITH SALPINGECTOMY: SHX6742

## 2017-10-16 LAB — CBC
HEMATOCRIT: 25.7 % — AB (ref 36.0–46.0)
HEMOGLOBIN: 7.8 g/dL — AB (ref 12.0–15.0)
MCH: 21.8 pg — AB (ref 26.0–34.0)
MCHC: 30.4 g/dL (ref 30.0–36.0)
MCV: 71.8 fL — ABNORMAL LOW (ref 78.0–100.0)
Platelets: 317 10*3/uL (ref 150–400)
RBC: 3.58 MIL/uL — ABNORMAL LOW (ref 3.87–5.11)
RDW: 16.5 % — AB (ref 11.5–15.5)
WBC: 15.9 10*3/uL — ABNORMAL HIGH (ref 4.0–10.5)

## 2017-10-16 LAB — PREPARE RBC (CROSSMATCH)

## 2017-10-16 SURGERY — HYSTERECTOMY, TOTAL, LAPAROSCOPIC, WITH SALPINGECTOMY
Anesthesia: General | Site: Bladder

## 2017-10-16 MED ORDER — KETOROLAC TROMETHAMINE 30 MG/ML IJ SOLN
INTRAMUSCULAR | Status: AC
  Filled 2017-10-16: qty 1

## 2017-10-16 MED ORDER — HYDROMORPHONE HCL 1 MG/ML IJ SOLN
INTRAMUSCULAR | Status: AC
  Administered 2017-10-16: 0.5 mg via INTRAVENOUS
  Filled 2017-10-16: qty 0.5

## 2017-10-16 MED ORDER — ALBUMIN HUMAN 5 % IV SOLN
12.5000 g | Freq: Once | INTRAVENOUS | Status: AC
  Administered 2017-10-16: 12.5 g via INTRAVENOUS
  Filled 2017-10-16: qty 250

## 2017-10-16 MED ORDER — OXYCODONE-ACETAMINOPHEN 5-325 MG PO TABS
1.0000 | ORAL_TABLET | Freq: Four times a day (QID) | ORAL | Status: DC | PRN
  Administered 2017-10-17: 1 via ORAL
  Filled 2017-10-16: qty 1

## 2017-10-16 MED ORDER — METHYLENE BLUE 0.5 % INJ SOLN
INTRAVENOUS | Status: DC | PRN
  Administered 2017-10-16: 35 mg via INTRAVENOUS

## 2017-10-16 MED ORDER — DEXAMETHASONE SODIUM PHOSPHATE 10 MG/ML IJ SOLN
INTRAMUSCULAR | Status: AC
  Filled 2017-10-16: qty 1

## 2017-10-16 MED ORDER — FENTANYL CITRATE (PF) 250 MCG/5ML IJ SOLN
INTRAMUSCULAR | Status: AC
  Filled 2017-10-16: qty 5

## 2017-10-16 MED ORDER — ONDANSETRON HCL 4 MG/2ML IJ SOLN
INTRAMUSCULAR | Status: DC | PRN
  Administered 2017-10-16: 4 mg via INTRAVENOUS

## 2017-10-16 MED ORDER — DEXAMETHASONE SODIUM PHOSPHATE 10 MG/ML IJ SOLN
INTRAMUSCULAR | Status: DC | PRN
  Administered 2017-10-16: 10 mg via INTRAVENOUS

## 2017-10-16 MED ORDER — SIMETHICONE 80 MG PO CHEW: 80.0000 mg | CHEWABLE_TABLET | Freq: Four times a day (QID) | ORAL | Status: DC | PRN

## 2017-10-16 MED ORDER — PROPOFOL 10 MG/ML IV BOLUS
INTRAVENOUS | Status: DC | PRN
  Administered 2017-10-16: 150 mg via INTRAVENOUS
  Administered 2017-10-16: 50 mg via INTRAVENOUS

## 2017-10-16 MED ORDER — HYDROMORPHONE HCL 1 MG/ML IJ SOLN
INTRAMUSCULAR | Status: AC
  Filled 2017-10-16: qty 1

## 2017-10-16 MED ORDER — DOCUSATE SODIUM 100 MG PO CAPS: 100.0000 mg | ORAL_CAPSULE | Freq: Two times a day (BID) | ORAL | Status: DC

## 2017-10-16 MED ORDER — HYDROMORPHONE HCL 1 MG/ML IJ SOLN
0.2500 mg | INTRAMUSCULAR | Status: DC | PRN
  Administered 2017-10-16 (×2): 0.5 mg via INTRAVENOUS

## 2017-10-16 MED ORDER — HYDROMORPHONE HCL 1 MG/ML IJ SOLN
INTRAMUSCULAR | Status: DC | PRN
  Administered 2017-10-16 (×2): 0.5 mg via INTRAVENOUS

## 2017-10-16 MED ORDER — HYDROMORPHONE 1 MG/ML IV SOLN
INTRAVENOUS | Status: DC
  Administered 2017-10-16: 1.2 mg via INTRAVENOUS
  Administered 2017-10-16: 19:00:00 via INTRAVENOUS
  Administered 2017-10-17: 0.6 mg via INTRAVENOUS
  Administered 2017-10-17 (×2): 0.9 mg via INTRAVENOUS
  Filled 2017-10-16: qty 25

## 2017-10-16 MED ORDER — SCOPOLAMINE 1 MG/3DAYS TD PT72
MEDICATED_PATCH | TRANSDERMAL | Status: AC
  Filled 2017-10-16: qty 1

## 2017-10-16 MED ORDER — ROCURONIUM BROMIDE 100 MG/10ML IV SOLN
INTRAVENOUS | Status: AC
  Filled 2017-10-16: qty 1

## 2017-10-16 MED ORDER — SIMETHICONE 40 MG/0.6ML PO SUSP: 40.0000 mg | Freq: Four times a day (QID) | ORAL | Status: DC

## 2017-10-16 MED ORDER — MIDAZOLAM HCL 2 MG/2ML IJ SOLN
INTRAMUSCULAR | Status: AC
  Filled 2017-10-16: qty 2

## 2017-10-16 MED ORDER — NALOXONE HCL 0.4 MG/ML IJ SOLN: 0.4000 mg | INTRAMUSCULAR | Status: DC | PRN

## 2017-10-16 MED ORDER — DIPHENHYDRAMINE HCL 12.5 MG/5ML PO ELIX: 12.5000 mg | ORAL_SOLUTION | Freq: Four times a day (QID) | ORAL | Status: DC | PRN

## 2017-10-16 MED ORDER — FENTANYL CITRATE (PF) 100 MCG/2ML IJ SOLN
INTRAMUSCULAR | Status: DC | PRN
  Administered 2017-10-16: 50 ug via INTRAVENOUS
  Administered 2017-10-16: 100 ug via INTRAVENOUS
  Administered 2017-10-16: 50 ug via INTRAVENOUS
  Administered 2017-10-16: 100 ug via INTRAVENOUS
  Administered 2017-10-16 (×2): 50 ug via INTRAVENOUS
  Administered 2017-10-16: 100 ug via INTRAVENOUS

## 2017-10-16 MED ORDER — MIDAZOLAM HCL 2 MG/2ML IJ SOLN
INTRAMUSCULAR | Status: DC | PRN
  Administered 2017-10-16: 2 mg via INTRAVENOUS

## 2017-10-16 MED ORDER — SODIUM CHLORIDE 0.9 % IR SOLN
Status: DC | PRN
  Administered 2017-10-16: 3000 mL

## 2017-10-16 MED ORDER — PROPOFOL 10 MG/ML IV BOLUS
INTRAVENOUS | Status: AC
  Filled 2017-10-16: qty 20

## 2017-10-16 MED ORDER — SODIUM CHLORIDE 0.9 % IJ SOLN
INTRAMUSCULAR | Status: DC | PRN
  Administered 2017-10-16: 10 mL

## 2017-10-16 MED ORDER — KETOROLAC TROMETHAMINE 30 MG/ML IJ SOLN
INTRAMUSCULAR | Status: DC | PRN
  Administered 2017-10-16: 30 mg via INTRAVENOUS

## 2017-10-16 MED ORDER — SODIUM CHLORIDE 0.9 % IV SOLN
INTRAVENOUS | Status: DC | PRN
Start: 2017-10-16 — End: 2017-10-16
  Administered 2017-10-16: 10:00:00 via INTRAVENOUS

## 2017-10-16 MED ORDER — ONDANSETRON HCL 4 MG/2ML IJ SOLN: 4.0000 mg | Freq: Four times a day (QID) | INTRAMUSCULAR | Status: DC | PRN

## 2017-10-16 MED ORDER — HYDROMORPHONE HCL 1 MG/ML IJ SOLN
INTRAMUSCULAR | Status: AC
  Filled 2017-10-16: qty 0.5

## 2017-10-16 MED ORDER — PROMETHAZINE HCL 25 MG/ML IJ SOLN: 6.2500 mg | INTRAMUSCULAR | Status: DC | PRN

## 2017-10-16 MED ORDER — PHENYLEPHRINE HCL 10 MG/ML IJ SOLN
INTRAMUSCULAR | Status: DC | PRN
  Administered 2017-10-16: 0.1 mg via INTRAVENOUS

## 2017-10-16 MED ORDER — CEFAZOLIN SODIUM-DEXTROSE 2-4 GM/100ML-% IV SOLN
2.0000 g | INTRAVENOUS | Status: AC
  Administered 2017-10-16 (×2): 2 g via INTRAVENOUS

## 2017-10-16 MED ORDER — STERILE WATER FOR IRRIGATION IR SOLN
Status: DC | PRN
  Administered 2017-10-16 (×2): 1000 mL via INTRAVESICAL

## 2017-10-16 MED ORDER — DIPHENHYDRAMINE HCL 50 MG/ML IJ SOLN: 12.5000 mg | Freq: Four times a day (QID) | INTRAMUSCULAR | Status: DC | PRN

## 2017-10-16 MED ORDER — SUGAMMADEX SODIUM 200 MG/2ML IV SOLN
INTRAVENOUS | Status: AC
  Filled 2017-10-16: qty 2

## 2017-10-16 MED ORDER — KETOROLAC TROMETHAMINE 30 MG/ML IJ SOLN: 30.0000 mg | Freq: Once | INTRAMUSCULAR | Status: DC | PRN

## 2017-10-16 MED ORDER — SUGAMMADEX SODIUM 200 MG/2ML IV SOLN
INTRAVENOUS | Status: DC | PRN
  Administered 2017-10-16: 174.6 mg via INTRAVENOUS

## 2017-10-16 MED ORDER — METHYLENE BLUE 0.5 % INJ SOLN
INTRAVENOUS | Status: AC
  Filled 2017-10-16: qty 10

## 2017-10-16 MED ORDER — SCOPOLAMINE 1 MG/3DAYS TD PT72
1.0000 | MEDICATED_PATCH | Freq: Once | TRANSDERMAL | Status: DC
  Administered 2017-10-16: 1.5 mg via TRANSDERMAL

## 2017-10-16 MED ORDER — ONDANSETRON HCL 4 MG PO TABS: 4.0000 mg | ORAL_TABLET | Freq: Three times a day (TID) | ORAL | Status: DC | PRN

## 2017-10-16 MED ORDER — ONDANSETRON HCL 4 MG/2ML IJ SOLN
INTRAMUSCULAR | Status: AC
  Filled 2017-10-16: qty 2

## 2017-10-16 MED ORDER — LIDOCAINE HCL (CARDIAC) 20 MG/ML IV SOLN
INTRAVENOUS | Status: DC | PRN
  Administered 2017-10-16: 100 mg via INTRAVENOUS

## 2017-10-16 MED ORDER — MEPERIDINE HCL 25 MG/ML IJ SOLN: 6.2500 mg | INTRAMUSCULAR | Status: DC | PRN

## 2017-10-16 MED ORDER — IBUPROFEN 600 MG PO TABS: 600.0000 mg | ORAL_TABLET | Freq: Four times a day (QID) | ORAL | Status: DC | PRN

## 2017-10-16 MED ORDER — KETOROLAC TROMETHAMINE 30 MG/ML IJ SOLN
30.0000 mg | Freq: Four times a day (QID) | INTRAMUSCULAR | Status: AC
  Administered 2017-10-16 – 2017-10-17 (×3): 30 mg via INTRAVENOUS
  Filled 2017-10-16 (×3): qty 1

## 2017-10-16 MED ORDER — BUPIVACAINE HCL (PF) 0.25 % IJ SOLN
INTRAMUSCULAR | Status: AC
  Filled 2017-10-16: qty 30

## 2017-10-16 MED ORDER — BUPIVACAINE HCL (PF) 0.25 % IJ SOLN
INTRAMUSCULAR | Status: DC | PRN
  Administered 2017-10-16: 11 mL
  Administered 2017-10-16: 1 mL

## 2017-10-16 MED ORDER — LIDOCAINE HCL (CARDIAC) 20 MG/ML IV SOLN
INTRAVENOUS | Status: AC
  Filled 2017-10-16: qty 5

## 2017-10-16 MED ORDER — LACTATED RINGERS IV SOLN
INTRAVENOUS | Status: DC
  Administered 2017-10-16 – 2017-10-17 (×2): 125 mL/h via INTRAVENOUS

## 2017-10-16 MED ORDER — ROCURONIUM BROMIDE 100 MG/10ML IV SOLN
INTRAVENOUS | Status: DC | PRN
  Administered 2017-10-16: 20 mg via INTRAVENOUS
  Administered 2017-10-16 (×2): 10 mg via INTRAVENOUS
  Administered 2017-10-16: 50 mg via INTRAVENOUS
  Administered 2017-10-16: 20 mg via INTRAVENOUS

## 2017-10-16 MED ORDER — SODIUM CHLORIDE 0.9% FLUSH: 9.0000 mL | INTRAVENOUS | Status: DC | PRN

## 2017-10-16 MED ORDER — LACTATED RINGERS IV SOLN
INTRAVENOUS | Status: DC
  Administered 2017-10-16 (×3): via INTRAVENOUS

## 2017-10-16 MED ORDER — MENTHOL 3 MG MT LOZG
1.0000 | LOZENGE | OROMUCOSAL | Status: DC | PRN
  Administered 2017-10-17: 3 mg via ORAL
  Filled 2017-10-16: qty 9

## 2017-10-16 SURGICAL SUPPLY — 62 items
ADH SKN CLS APL DERMABOND .7 (GAUZE/BANDAGES/DRESSINGS) ×6
BAG URINE DRAINAGE (UROLOGICAL SUPPLIES) ×2 IMPLANT
BARRIER ADHS 3X4 INTERCEED (GAUZE/BANDAGES/DRESSINGS) ×3 IMPLANT
BRR ADH 4X3 ABS CNTRL BYND (GAUZE/BANDAGES/DRESSINGS)
CABLE HIGH FREQUENCY MONO STRZ (ELECTRODE) ×5 IMPLANT
CATH FOLEY 3WAY  5CC 18FR (CATHETERS) ×2
CATH FOLEY 3WAY 5CC 18FR (CATHETERS) IMPLANT
COVER LIGHT HANDLE  1/PK (MISCELLANEOUS) ×2
COVER LIGHT HANDLE 1/PK (MISCELLANEOUS) IMPLANT
DEFOGGER SCOPE WARMER CLEARIFY (MISCELLANEOUS) ×5 IMPLANT
DERMABOND ADVANCED (GAUZE/BANDAGES/DRESSINGS) ×4
DERMABOND ADVANCED .7 DNX12 (GAUZE/BANDAGES/DRESSINGS) ×3 IMPLANT
DURAPREP 26ML APPLICATOR (WOUND CARE) ×5 IMPLANT
FILTER SMOKE EVAC LAPAROSHD (FILTER) ×3 IMPLANT
FORMULA 20CAL 3 OZ MEAD (FORMULA) ×2 IMPLANT
GLOVE BIO SURGEON STRL SZ 6.5 (GLOVE) ×4 IMPLANT
GLOVE BIO SURGEON STRL SZ7 (GLOVE) ×5 IMPLANT
GLOVE BIO SURGEONS STRL SZ 6.5 (GLOVE) ×1
GLOVE BIOGEL PI IND STRL 7.0 (GLOVE) ×12 IMPLANT
GLOVE BIOGEL PI INDICATOR 7.0 (GLOVE) ×10
GOWN STRL REUS W/ TWL LRG LVL3 (GOWN DISPOSABLE) ×9 IMPLANT
GOWN STRL REUS W/TWL LRG LVL3 (GOWN DISPOSABLE) ×15
LIGASURE VESSEL 5MM BLUNT TIP (ELECTROSURGICAL) ×5 IMPLANT
MANIPULATOR ADVINCU DEL 3.0 PL (MISCELLANEOUS) ×2 IMPLANT
MANIPULATOR ADVINCU DEL 3.5 PL (MISCELLANEOUS) IMPLANT
MANIPULATOR ADVINCU DEL 4.0 PL (MISCELLANEOUS) IMPLANT
NEEDLE INSUFFLATION 120MM (ENDOMECHANICALS) ×2 IMPLANT
NS IRRIG 1000ML POUR BTL (IV SOLUTION) ×5 IMPLANT
PACK LAPAROSCOPY BASIN (CUSTOM PROCEDURE TRAY) ×5 IMPLANT
PACK TRENDGUARD 450 HYBRID PRO (MISCELLANEOUS) IMPLANT
PACK TRENDGUARD 600 HYBRD PROC (MISCELLANEOUS) IMPLANT
PLUG CATH AND CAP STER (CATHETERS) ×2 IMPLANT
POUCH LAPAROSCOPIC INSTRUMENT (MISCELLANEOUS) ×5 IMPLANT
PROTECTOR NERVE ULNAR (MISCELLANEOUS) ×10 IMPLANT
SCISSORS LAP 5X35 DISP (ENDOMECHANICALS) ×5 IMPLANT
SET CYSTO W/LG BORE CLAMP LF (SET/KITS/TRAYS/PACK) ×5 IMPLANT
SET IRRIG TUBING LAPAROSCOPIC (IRRIGATION / IRRIGATOR) ×5 IMPLANT
SET TRI-LUMEN FLTR TB AIRSEAL (TUBING) ×5 IMPLANT
SLEEVE XCEL OPT CAN 5 100 (ENDOMECHANICALS) ×5 IMPLANT
SPONGE LAP 18X18 X RAY DECT (DISPOSABLE) ×2 IMPLANT
SUT MON AB 3-0 SH 27 (SUTURE) ×50
SUT MON AB 3-0 SH27 (SUTURE) IMPLANT
SUT VIC AB 0 CT1 18XCR BRD8 (SUTURE) IMPLANT
SUT VIC AB 0 CT1 8-18 (SUTURE)
SUT VIC AB 2-0 CT1 (SUTURE) IMPLANT
SUT VIC AB 3-0 SH 27 (SUTURE) ×30
SUT VIC AB 3-0 SH 27X BRD (SUTURE) IMPLANT
SUT VICRYL 0 UR6 27IN ABS (SUTURE) ×5 IMPLANT
SUT VICRYL 4-0 PS2 18IN ABS (SUTURE) ×5 IMPLANT
SUT VLOC 180 0 9IN  GS21 (SUTURE) ×2
SUT VLOC 180 0 9IN GS21 (SUTURE) IMPLANT
SYR 10ML LL (SYRINGE) ×2 IMPLANT
SYR 50ML LL SCALE MARK (SYRINGE) ×5 IMPLANT
SYR BULB IRRIGATION 50ML (SYRINGE) ×3 IMPLANT
TOWEL OR 17X24 6PK STRL BLUE (TOWEL DISPOSABLE) ×15 IMPLANT
TRAY FOLEY CATH SILVER 14FR (SET/KITS/TRAYS/PACK) ×5 IMPLANT
TRENDGUARD 450 HYBRID PRO PACK (MISCELLANEOUS) ×5
TRENDGUARD 600 HYBRID PROC PK (MISCELLANEOUS)
TROCAR PORT AIRSEAL 8X120 (TROCAR) ×5 IMPLANT
TROCAR XCEL NON-BLD 11X100MML (ENDOMECHANICALS) ×5 IMPLANT
TROCAR XCEL NON-BLD 5MMX100MML (ENDOMECHANICALS) ×5 IMPLANT
WARMER LAPAROSCOPE (MISCELLANEOUS) ×5 IMPLANT

## 2017-10-16 NOTE — Anesthesia Postprocedure Evaluation (Signed)
Anesthesia Post Note  Patient: Designer, jewellery  Procedure(s) Performed: TOTAL LAPAROSCOPIC HYSTERECTOMY WITH SALPINGECTOMY (Bilateral Abdomen) CYSTOSCOPY (N/A Bladder) LAPAROSCOPIC LYSIS OF ADHESIONS (N/A Abdomen) REPAIR OF CYSTOTOMY (N/A Bladder)     Patient location during evaluation: PACU Anesthesia Type: General Level of consciousness: sedated Pain management: pain level controlled Vital Signs Assessment: post-procedure vital signs reviewed and stable Respiratory status: spontaneous breathing Cardiovascular status: stable Postop Assessment: no apparent nausea or vomiting Anesthetic complications: no    Last Vitals:  Vitals:   10/16/17 1615 10/16/17 1630  BP: 132/80 139/79  Pulse: (!) 122 (!) 120  Resp: 15 (!) 7  Temp:    SpO2: 100% 100%    Last Pain:  Vitals:   10/16/17 1630  TempSrc:   PainSc: 5    Pain Goal: Patients Stated Pain Goal: 3 (10/16/17 0717)               Biff Rutigliano JR,JOHN Mateo Flow

## 2017-10-16 NOTE — H&P (Signed)
Lori Andrews is an 42 y.o. female P0 for scheduled hysterectomy for long standing chronic pelvic pain.  Patient with known diagnosis of endometriosis. Her pain is worse when she does have a menses.  She has PCOS so she has oligomenorrhea.  She has tried conservative management with OCPs and Depo Provera without alleviation of her pain.  She now desires definitive management.   Pertinent Gynecological History: Menses: with severe dysmenorrhea Bleeding: dysfunctional uterine bleeding Contraception: none DES exposure: denies Blood transfusions: none Sexually transmitted diseases: no past history Previous GYN Procedures: Laparoscopy  Last mammogram: has had one yet Last pap: normal Date: 2019 OB History: G0, P0   Menstrual History: Menarche age: 57 No LMP recorded (approximate). Patient is not currently having periods (Reason: Other).    Past Medical History:  Diagnosis Date  . Anemia   . Chlamydia    Over ten years ago  . Complication of anesthesia    Difficulty waking up from anesthetic  . Endometriosis 02/29/2012  . Ovarian cyst   . Pelvic pain in female 02/29/2012  . Trichimoniasis    Over 10 years ago    Past Surgical History:  Procedure Laterality Date  . BREAST LUMPECTOMY    . BREAST SURGERY     Lump removed at 42 yo  . DILATION AND CURETTAGE OF UTERUS  02/29/2012   Procedure: DILATATION AND CURETTAGE;  Surgeon: Lori Royals, MD;  Location: [facility];  Service: Gynecology;  Laterality: N/A;  . ENDOMETRIAL ABLATION  02/29/2012   Procedure: ENDOMETRIAL ABLATION;  Surgeon: Lori Royals, MD;  Location: [facility];  Service: Gynecology;  Laterality: N/A;  . LAPAROSCOPY  02/29/2012   Procedure: LAPAROSCOPY OPERATIVE;  Surgeon: Lori Royals, MD;  Location: [facility];  Service: Gynecology;  Laterality: N/A;  w/ Chromopertubation  . WISDOM TOOTH EXTRACTION      Family History  Problem Relation Age of Onset  . Diabetes Mother   . Diabetes Father   . Hyperlipidemia Father    . Diabetes Sister     Social History:  reports that  has never smoked. she has never used smokeless tobacco. She reports that she drinks alcohol. She reports that she does not use drugs.  Allergies:  Allergies  Allergen Reactions  . Codeine Itching  . Red Dye Itching    Medications Prior to Admission  Medication Sig Dispense Refill Last Dose  . HYDROcodone-acetaminophen (NORCO/VICODIN) 5-325 MG tablet Take 1 tablet by mouth every 6 (six) hours as needed for moderate pain.   10/15/2017 at 2200  . Hydrocodone-Acetaminophen 7.5-300 MG TABS Take 1 tablet by mouth every 6 (six) hours as needed for moderate pain.    Past Week at Unknown time  . ibuprofen (ADVIL,MOTRIN) 800 MG tablet Take 800 mg by mouth every 8 (eight) hours as needed for headache or mild pain.    Past Week at Unknown time  . Multiple Vitamins-Minerals (HM MULTIVITAMIN ADULT GUMMY PO) Take 1 each by mouth daily.   10/15/2017 at Unknown time  . dicyclomine (BENTYL) 20 MG tablet Take 1 tablet (20 mg total) by mouth 2 (two) times daily as needed for spasms. (Patient not taking: Reported on 10/01/2017) 20 tablet 0 Not Taking at Unknown time    ROS  Blood pressure (!) 142/94, pulse 78, temperature 97.8 F (36.6 C), temperature source Oral, resp. rate 16, SpO2 99 %. Physical Exam Exam deferred to OR No results found for this or any previous visit (from the past 24 hour(s)).  No results  found.  Assessment/Plan: Patient on call to OR for TLH / Bilateral salpingectomy / cystoscopy  The risks of the procedure discussed with the patient to include, but not limited to bleeding possibly requiring a transfusion, infection, wound breakdown or poor healing, damage to organs in abdomen and pelvis w/ possible need for further surgical repair, need for an open abdominal incision to complete procedure, blood clots, PE/MI/stroke.   We also discussed diagnosis of malignancy after the surgery, requiring further surgery (ie staging post  operatively). Additionally, risks versus benefits of ovarian preservation were discussed at length. She was counseled about the 1/70 lifetime risk of ovarian cancer and 5-10% risk for need of future surgery for ovarian pathology. Pt understands these risks and consents to surgery. Consent signed and scanned into the chart. She desire ovarian preservation at this time.  Patient will f/u one week post op and 4 weeks post op  Lori Andrews, [place] 10/16/2017, 8:49 AM

## 2017-10-16 NOTE — Anesthesia Procedure Notes (Signed)
Procedure Name: Intubation Date/Time: 10/16/2017 9:21 AM Performed by: Hewitt Blade, CRNA Pre-anesthesia Checklist: Patient identified, Emergency Drugs available, Suction available and Patient being monitored Patient Re-evaluated:Patient Re-evaluated prior to induction Oxygen Delivery Method: Circle system utilized Preoxygenation: Pre-oxygenation with 100% oxygen Induction Type: IV induction Ventilation: Mask ventilation without difficulty and Oral airway inserted - appropriate to patient size Laryngoscope Size: Mac and 3 Grade View: Grade I Tube type: Oral Tube size: 7.0 mm Number of attempts: 1 Airway Equipment and Method: Stylet Placement Confirmation: ETT inserted through vocal cords under direct vision,  positive ETCO2 and breath sounds checked- equal and bilateral Secured at: 23 cm Tube secured with: Tape Dental Injury: Teeth and Oropharynx as per pre-operative assessment

## 2017-10-16 NOTE — Anesthesia Preprocedure Evaluation (Signed)
Anesthesia Evaluation  Patient identified by MRN, date of birth, ID band Patient awake    Reviewed: Allergy & Precautions, H&P , NPO status , Patient's Chart, lab work & pertinent test results, reviewed documented beta blocker date and time   History of Anesthesia Complications (+) PROLONGED EMERGENCE and history of anesthetic complications  Airway Mallampati: I  TM Distance: >3 FB Neck ROM: full    Dental  (+) Teeth Intact Upper and lower braces:   Pulmonary neg pulmonary ROS,    Pulmonary exam normal breath sounds clear to auscultation       Cardiovascular negative cardio ROS   Rhythm:regular Rate:Normal     Neuro/Psych negative neurological ROS  negative psych ROS   GI/Hepatic negative GI ROS, Neg liver ROS,   Endo/Other  negative endocrine ROS  Renal/GU negative Renal ROS  Female GU complaint     Musculoskeletal   Abdominal (+) + obese,   Peds  Hematology  (+) anemia ,   Anesthesia Other Findings   Reproductive/Obstetrics negative OB ROS                             Anesthesia Physical  Anesthesia Plan  ASA: II  Anesthesia Plan: General   Post-op Pain Management:    Induction: Intravenous  PONV Risk Score and Plan: 3 and Ondansetron, Dexamethasone, Scopolamine patch - Pre-op and Midazolam  Airway Management Planned: Oral ETT  Additional Equipment:   Intra-op Plan:   Post-operative Plan: Extubation in OR  Informed Consent: I have reviewed the patients History and Physical, chart, labs and discussed the procedure including the risks, benefits and alternatives for the proposed anesthesia with the patient or authorized representative who has indicated his/her understanding and acceptance.   Dental Advisory Given  Plan Discussed with: CRNA and Surgeon  Anesthesia Plan Comments:         Anesthesia Quick Evaluation

## 2017-10-16 NOTE — Transfer of Care (Signed)
Immediate Anesthesia Transfer of Care Note  Patient: Wyline Beady  Procedure(s) Performed: TOTAL LAPAROSCOPIC HYSTERECTOMY WITH SALPINGECTOMY (Bilateral Abdomen) CYSTOSCOPY (N/A Bladder) LAPAROSCOPIC LYSIS OF ADHESIONS (N/A Abdomen) REPAIR OF CYSTOTOMY (N/A Bladder)  Patient Location: PACU  Anesthesia Type:General  Level of Consciousness: awake, alert  and oriented  Airway & Oxygen Therapy: Patient Spontanous Breathing and Patient connected to nasal cannula oxygen  Post-op Assessment: Report given to RN, Post -op Vital signs reviewed and stable and Patient moving all extremities  Post vital signs: Reviewed and stable  Last Vitals:  Vitals:   10/16/17 1556 10/16/17 1600  BP: 139/84 137/86  Pulse: (!) 131 (!) 128  Resp:    Temp: (P) 37.1 C   SpO2: 98% 98%    Last Pain:  Vitals:   10/16/17 0717  TempSrc: Oral      Patients Stated Pain Goal: 3 (92/33/00 7622)  Complications: No apparent anesthesia complications

## 2017-10-17 ENCOUNTER — Encounter (HOSPITAL_COMMUNITY): Payer: Self-pay | Admitting: Obstetrics & Gynecology

## 2017-10-17 DIAGNOSIS — Z9889 Other specified postprocedural states: Secondary | ICD-10-CM

## 2017-10-17 DIAGNOSIS — N800 Endometriosis of uterus: Secondary | ICD-10-CM | POA: Diagnosis not present

## 2017-10-17 LAB — CBC
HEMATOCRIT: 23.8 % — AB (ref 36.0–46.0)
HEMOGLOBIN: 7.3 g/dL — AB (ref 12.0–15.0)
MCH: 22.1 pg — AB (ref 26.0–34.0)
MCHC: 30.7 g/dL (ref 30.0–36.0)
MCV: 72.1 fL — AB (ref 78.0–100.0)
Platelets: 294 10*3/uL (ref 150–400)
RBC: 3.3 MIL/uL — ABNORMAL LOW (ref 3.87–5.11)
RDW: 16.5 % — AB (ref 11.5–15.5)
WBC: 15.2 10*3/uL — ABNORMAL HIGH (ref 4.0–10.5)

## 2017-10-17 LAB — BASIC METABOLIC PANEL
Anion gap: 5 (ref 5–15)
BUN: 7 mg/dL (ref 6–20)
CHLORIDE: 108 mmol/L (ref 101–111)
CO2: 24 mmol/L (ref 22–32)
CREATININE: 0.71 mg/dL (ref 0.44–1.00)
Calcium: 8.1 mg/dL — ABNORMAL LOW (ref 8.9–10.3)
GFR calc Af Amer: >60 mL/min (ref >60)
GFR calc non Af Amer: >60 mL/min (ref >60)
GLUCOSE: 94 mg/dL (ref 65–99)
Potassium: 3.9 mmol/L (ref 3.5–5.1)
Sodium: 137 mmol/L (ref 135–145)

## 2017-10-17 LAB — PREPARE RBC (CROSSMATCH)

## 2017-10-17 MED ORDER — BUPIVACAINE IN DEXTROSE 0.75-8.25 % IT SOLN
INTRATHECAL | Status: AC
  Filled 2017-10-17: qty 2

## 2017-10-17 MED ORDER — CYCLOBENZAPRINE HCL 10 MG PO TABS
10.0000 mg | ORAL_TABLET | Freq: Three times a day (TID) | ORAL | Status: DC | PRN
  Administered 2017-10-18: 10 mg via ORAL
  Filled 2017-10-17 (×2): qty 1

## 2017-10-17 MED ORDER — SODIUM CHLORIDE 0.9 % IV SOLN
Freq: Once | INTRAVENOUS | Status: AC
  Administered 2017-10-17: 15:00:00 via INTRAVENOUS

## 2017-10-17 MED ORDER — IBUPROFEN 600 MG PO TABS
600.0000 mg | ORAL_TABLET | Freq: Four times a day (QID) | ORAL | Status: DC
  Administered 2017-10-17 – 2017-10-18 (×5): 600 mg via ORAL
  Filled 2017-10-17 (×5): qty 1

## 2017-10-17 MED ORDER — OXYCODONE-ACETAMINOPHEN 5-325 MG PO TABS
1.0000 | ORAL_TABLET | ORAL | Status: DC | PRN
  Administered 2017-10-17: 2 via ORAL
  Administered 2017-10-18: 1 via ORAL
  Filled 2017-10-17: qty 2
  Filled 2017-10-17: qty 1

## 2017-10-17 MED ORDER — ACETAMINOPHEN 325 MG PO TABS
650.0000 mg | ORAL_TABLET | Freq: Once | ORAL | Status: DC
  Filled 2017-10-17 (×2): qty 2

## 2017-10-17 MED ORDER — FUROSEMIDE 10 MG/ML IJ SOLN
20.0000 mg | Freq: Once | INTRAMUSCULAR | Status: AC
  Administered 2017-10-17: 20 mg via INTRAVENOUS
  Filled 2017-10-17: qty 2

## 2017-10-17 MED ORDER — DIPHENHYDRAMINE HCL 50 MG/ML IJ SOLN
25.0000 mg | Freq: Once | INTRAMUSCULAR | Status: AC
  Administered 2017-10-17: 25 mg via INTRAVENOUS
  Filled 2017-10-17: qty 1

## 2017-10-17 NOTE — Progress Notes (Signed)
1 Day Post-Op Procedure(s) (LRB): TOTAL LAPAROSCOPIC HYSTERECTOMY WITH SALPINGECTOMY (Bilateral) CYSTOSCOPY (N/A) LAPAROSCOPIC LYSIS OF ADHESIONS (N/A) REPAIR OF CYSTOTOMY (N/A)  Subjective: Patient reports incisional pain, tolerating PO and + flatus.   She reports severe fatigue, shortness of breath  And dizziness with walking.  She also notes lower back pain.   Objective: I have reviewed patient's vital signs, intake and output, medications and labs.  General: alert, cooperative and no distress GI: Abdomen soft, Appropriately-tender;  abnormal findings:  Distended incision: clean, dry and intact Extremities: extremities normal, atraumatic, no cyanosis or edema and Homans sign is negative, no sign of DVT Vaginal Bleeding: none  Assessment: s/p Procedure(s): TOTAL LAPAROSCOPIC HYSTERECTOMY WITH SALPINGECTOMY (Bilateral) CYSTOSCOPY (N/A) LAPAROSCOPIC LYSIS OF ADHESIONS (N/A) REPAIR OF CYSTOTOMY (N/A):  Patient doing well post operatively Urine output adequate Post operative pain Hb 7.3 - symptomatic anemia with dizziness, fatigue,   Plan: Will change admission status from outpatient to admit Transfuse 3 units PRBCs as patient is symptomatic Discussed cystotomy with patient and she is okay with catheter  Will increase pain medication to 2 tablets of Percocet q4-6 hours Will prescribe cyclobenzaprine prn back spasm Will f/u H/H post transfusion IV lasix post transfusion   LOS: 0 days    Alisi Lupien, [place] 10/17/2017, 3:12 PM

## 2017-10-17 NOTE — Anesthesia Postprocedure Evaluation (Signed)
Anesthesia Post Note  Patient: Designer, jewellery  Procedure(s) Performed: TOTAL LAPAROSCOPIC HYSTERECTOMY WITH SALPINGECTOMY (Bilateral Abdomen) CYSTOSCOPY (N/A Bladder) LAPAROSCOPIC LYSIS OF ADHESIONS (N/A Abdomen) REPAIR OF CYSTOTOMY (N/A Bladder)     Patient location during evaluation: [facility] Anesthesia Type: General Level of consciousness: awake Pain management: satisfactory to patient Vital Signs Assessment: post-procedure vital signs reviewed and stable Respiratory status: spontaneous breathing Cardiovascular status: stable Anesthetic complications: no    Last Vitals:  Vitals:   10/17/17 0536 10/17/17 0557  BP: (!) 118/56   Pulse: 100   Resp: 15 16  Temp: 36.5 C   SpO2: 100% 99%    Last Pain:  Vitals:   10/17/17 0718  TempSrc:   PainSc: 0-No pain   Pain Goal: Patients Stated Pain Goal: 3 (10/17/17 0557)               Casimer Lanius

## 2017-10-17 NOTE — Discharge Instructions (Signed)
Call [facility] @ [phone] if:  You have a temperature greater than or equal to 100.4 degrees Farenheit orally You have pain that is not made better by the pain medication given and taken as directed You have excessive bleeding or problems urinating  Take Colace (Docusate Sodium/Stool Softener) 100 mg 2-3 times daily while taking narcotic pain medicine to avoid constipation or until bowel movements are regular. Take Ibuprofen 600 mg with food,  every 6 hours for 5 days then as needed for pain  You may drive after 2 weeks You may walk up steps  You may shower  You may resume a regular diet  Keep incisions clean and dry; remove "honeycomb" dressing 10/24/2017 Do not lift over 15 pounds for 6 weeks Avoid anything in vagina for 6 weeks (or until after your post-operative visit)

## 2017-10-17 NOTE — Addendum Note (Signed)
Addendum  created 10/17/17 0809 by Asher Muir, CRNA   Sign clinical note

## 2017-10-17 NOTE — Progress Notes (Signed)
Infant Doane is a72 y.o.  [id number]  Post Op Date # 1:  TLH/BS/LOA/Repair of Cystotomy/Cystoscopy  Subjective: Patient is Doing well postoperatively. Patient has Pain is controlled with current analgesics. Medications being used: prescription NSAID's including Ketorolac 30 mg IV and narcotic analgesics including PCA Dilaudid.. Patient ambulating in the halls with some dizziness,  tolerating liquids without nausea,  has passed flatus but still complains of some sharp shooting pains on left side occasionally.   Objective: Vital signs in last 24 hours: Temp:  [97.7 F (36.5 C)-98.8 F (37.1 C)] 97.7 F (36.5 C) (03/07 0536) Pulse Rate:  [100-131] 100 (03/07 0536) Resp:  [7-20] 16 (03/07 0557) BP: (118-144)/(56-89) 118/56 (03/07 0536) SpO2:  [98 %-100 %] 99 % (03/07 0557) Weight:  [191 lb (86.6 kg)] 191 lb (86.6 kg) (03/06 1900)  Intake/Output from previous day: 03/06 0701 - 03/07 0700 In: 5378.7 [P.O.:460; I.V.:4918.7] Out: 3250 [Urine:3125] Intake/Output this shift: No intake/output data recorded. Recent Labs  Lab 10/11/17 1430 10/16/17 1740 10/17/17 0543  WBC 10.9* 15.9* 15.2*  HGB 8.2* 7.8* 7.3*  HCT 27.1* 25.7* 23.8*  PLT 349 317 294     Recent Labs  Lab 10/17/17 0543  NA 137  K 3.9  CL 108  CO2 24  BUN 7  CREATININE 0.71  CALCIUM 8.1*  GLUCOSE 94    EXAM: General: alert, cooperative and no distress Resp: clear to auscultation bilaterally Cardio: regular rate and rhythm, S1, S2 normal, no murmur, click, rub or gallop GI:  Decreased bowel sounds in all quadrants;  mild distention; dressings clean/dry/intact Extremities: Homans sign is negative, no sign of DVT and SCD hose in place; no calf tenderness. Vaginal Bleeding: none  Abdominal incisions with good wound edge approximation and no evidence of infection   Assessment: s/p Procedure(s): TOTAL LAPAROSCOPIC HYSTERECTOMY WITH SALPINGECTOMY CYSTOSCOPY LAPAROSCOPIC LYSIS OF ADHESIONS REPAIR OF  CYSTOTOMY: stable and anemia  Plan: Advance diet Encourage ambulation Advance to PO medication Continue foley due to strict I&O and healing cystotomy. Routine care.  LOS: 0 days    Earnstine Regal, PA-C 10/17/2017 7:46 AM

## 2017-10-18 ENCOUNTER — Inpatient Hospital Stay (HOSPITAL_COMMUNITY): Payer: BLUE CROSS/BLUE SHIELD

## 2017-10-18 ENCOUNTER — Encounter (HOSPITAL_COMMUNITY): Payer: Self-pay | Admitting: Radiology

## 2017-10-18 DIAGNOSIS — N800 Endometriosis of uterus: Secondary | ICD-10-CM | POA: Diagnosis not present

## 2017-10-18 LAB — TYPE AND SCREEN
ABO/RH(D): O POS
Antibody Screen: NEGATIVE
UNIT DIVISION: 0
UNIT DIVISION: 0
UNIT DIVISION: 0
Unit division: 0
Unit division: 0

## 2017-10-18 LAB — BPAM RBC
BLOOD PRODUCT EXPIRATION DATE: 201903132359
BLOOD PRODUCT EXPIRATION DATE: 201904032359
Blood Product Expiration Date: 201903132359
Blood Product Expiration Date: 201903252359
Blood Product Expiration Date: 201904022359
ISSUE DATE / TIME: 201903060938
ISSUE DATE / TIME: 201903071708
ISSUE DATE / TIME: 201903071925
ISSUE DATE / TIME: 201903060938
ISSUE DATE / TIME: 201903071503
UNIT TYPE AND RH: 5100
UNIT TYPE AND RH: 9500
Unit Type and Rh: 5100
Unit Type and Rh: 5100
Unit Type and Rh: 9500

## 2017-10-18 LAB — HEMOGLOBIN AND HEMATOCRIT, BLOOD
HCT: 33.7 % — ABNORMAL LOW (ref 36.0–46.0)
HEMOGLOBIN: 10.9 g/dL — AB (ref 12.0–15.0)

## 2017-10-18 MED ORDER — IBUPROFEN 800 MG PO TABS: 800.0000 mg | ORAL_TABLET | Freq: Three times a day (TID) | ORAL | 1 refills | Status: AC | PRN

## 2017-10-18 MED ORDER — OXYCODONE-ACETAMINOPHEN 5-325 MG PO TABS: 1.0000 | ORAL_TABLET | ORAL | 0 refills | Status: AC | PRN

## 2017-10-18 MED ORDER — IOPAMIDOL (ISOVUE-370) INJECTION 76%
100.0000 mL | Freq: Once | INTRAVENOUS | Status: AC | PRN
  Administered 2017-10-18: 100 mL via INTRAVENOUS

## 2017-10-18 MED ORDER — UROCARE FOLEY CATHETER STRAP MISC: 0 refills | Status: AC

## 2017-10-18 MED ORDER — NITROFURANTOIN MONOHYD MACRO 100 MG PO CAPS: 100.0000 mg | ORAL_CAPSULE | Freq: Two times a day (BID) | ORAL | 0 refills | Status: AC

## 2017-10-18 MED ORDER — CYCLOBENZAPRINE HCL 10 MG PO TABS: 10.0000 mg | ORAL_TABLET | Freq: Three times a day (TID) | ORAL | 0 refills | Status: AC | PRN

## 2017-10-18 MED ORDER — FUROSEMIDE 40 MG PO TABS
40.0000 mg | ORAL_TABLET | Freq: Once | ORAL | Status: AC
  Administered 2017-10-18: 40 mg via ORAL
  Filled 2017-10-18: qty 1

## 2017-10-18 NOTE — Progress Notes (Signed)
Damary Doland is a69 y.o.  [id number]  Post Op Date # 2:  TLH/BS/LOA/Repair of Cystotomy/Cystoscopy  Subjective: Patient is Doing well postoperatively. Patient has Pain is controlled with current analgesics. Medications being used: prescription NSAID's including Ibuprofen 600 mg and narcotic analgesics including Percocet 5/325 (2).  Ambulating without dizziness, no longer short of breath though reports "soreness" of airway with deep breathing. Tolerating a regular diet and passing flatus.  Adequate urine output with approximately 50 cc in Foley bag-clear.  Objective: Vital signs in last 24 hours: Temp:  [97.5 F (36.4 C)-99.7 F (37.6 C)] 98.7 F (37.1 C) (03/08 0456) Pulse Rate:  [76-110] 88 (03/08 0456) Resp:  [16-18] 17 (03/08 0456) BP: (116-128)/(57-69) 127/66 (03/08 0456) SpO2:  [90 %-100 %] 90 % (03/08 0456)  Intake/Output from previous day: 03/07 0701 - 03/08 0700 In: 2877.1 [P.O.:1560; I.V.:350] Out: 2250 [Urine:2250] Intake/Output this shift: No intake/output data recorded. Recent Labs  Lab 10/11/17 1430 10/16/17 1740 10/17/17 0543 10/17/17 2342  WBC 10.9* 15.9* 15.2*  --   HGB 8.2* 7.8* 7.3* 10.9*  HCT 27.1* 25.7* 23.8* 33.7*  PLT 349 317 294  --      Recent Labs  Lab 10/17/17 0543  NA 137  K 3.9  CL 108  CO2 24  BUN 7  CREATININE 0.71  CALCIUM 8.1*  GLUCOSE 94    EXAM: General: fatigued and no distress Resp: clear to auscultation bilaterally Cardio: regular rate and rhythm, S1, S2 normal, no murmur, click, rub or gallop GI: Bowel sounds present, incisions without evidence of infection and intact; wound dressing is clean/dry/intact. Extremities: Homans sign is negative, no sign of DVT and SCD hose in place and functioning; no calf tenderness. Vaginal Bleeding: none **Breath sounds though clear are distant and soft.  Assessment: s/p Procedure(s): TOTAL LAPAROSCOPIC HYSTERECTOMY WITH SALPINGECTOMY CYSTOSCOPY LAPAROSCOPIC LYSIS OF  ADHESIONS REPAIR OF CYSTOTOMY: stable, tolerating diet and anemia  Plan: Spiral CT of the Chest per Dr. Alwyn Pea  Routine Care  LOS: 1 day    Earnstine Regal, PA-C 10/18/2017 7:54 AM

## 2017-10-18 NOTE — Discharge Summary (Signed)
Physician Discharge Summary  Patient ID: Lori Andrews MRN: [id number] DOB/AGE: 1976-04-01 42 y.o.  Admit date: 10/16/2017 Discharge date: 10/18/2017  Admission Diagnoses:  Chronic pelvic pain / endometriosis  Discharge Diagnoses:  Active Problems:   Chronic pelvic pain in female   Status post laparoscopy Cystotomy   Discharged Condition: good  Hospital Course: patient taken to operating room where total laparoscopic hysterectomy and  Bilateral salpingectomy was performed.  Intraoperative cystotomy occurred but was repaired at time of procedure laparoscopically. On POD#1 patient had symptomatic anemia and her Hb was noted to be low at 7.3 so a blood transfusion was given (total of 3 Units PRBCs)  On POD#2 patient had shortness of breath and her PO2 was lower so a spiral CT was ordered to rule out PE.  The CT scan was negative and the patient's clinical condition improved.  Patient was then discharged home with foley leg bag  Consults: None  Significant Diagnostic Studies: labs: post transfusion hb: 10.9 and radiology: CT scan: Spiral CT negative for PE  Treatments: IV hydration, antibiotics: Ancef, analgesia: Vicodin and surgery: TLH/ Bilateral salpingectomy  Lysis of adhesions, repair of cystotomy Discharge Exam: Blood pressure (!) 151/81, pulse 88, temperature 98.7 F (37.1 C), temperature source Oral, resp. rate 16, height 5\' 3"  (1.6 m), weight 86.6 kg (191 lb), SpO2 97 %.  General: alert, cooperative and no distress GI: soft, appropriately tender mild distention, incisions clean, dry and intact Extremities: extremities normal, atraumatic, no cyanosis or edema and Homans sign is negative, no sign of DVT Vaginal Bleeding: none  Disposition: 01-Home or Self Care  Discharge Instructions     Remove dressing in 72 hours   Complete by:  As directed    Call MD for:   Complete by:  As directed    Abnormal foul smelling vaginal discharge or heavy vaginal bleeding   Call MD for:   difficulty breathing, headache or visual disturbances   Complete by:  As directed    Call MD for:  hives   Complete by:  As directed    Call MD for:  persistant dizziness or light-headedness   Complete by:  As directed    Call MD for:  persistant nausea and vomiting   Complete by:  As directed    Call MD for:  redness, tenderness, or signs of infection (pain, swelling, redness, odor or green/yellow discharge around incision site)   Complete by:  As directed    Call MD for:  severe uncontrolled pain   Complete by:  As directed    Call MD for:  temperature >100.4   Complete by:  As directed    Catheter care   Complete by:  As directed    Diet - low sodium heart healthy   Complete by:  As directed    Discharge instructions   Complete by:  As directed    If you have any concerns or questions call office or Dr. Alwyn Pea immediately   Discharge wound care:   Complete by:  As directed    Your incisions have skin glue which will dissolve over time.  You may shower, no baths. Don't use lotions or savs on incision sites.   Do Not Remove Foley   Complete by:  As directed    Driving Restrictions   Complete by:  As directed    No driving for 10 days or while taking any narcotic medications   Increase activity slowly   Complete by:  As directed  Lifting restrictions   Complete by:  As directed    No heavy lifting, nothing heavier than a gallon a milk or approximately 20 pounds   Urinary leg bag   Complete by:  As directed      Allergies as of 10/18/2017      Reactions   Codeine Itching   Red Dye Itching      Medication List    STOP taking these medications   HYDROcodone-acetaminophen 5-325 MG tablet Commonly known as:  NORCO/VICODIN   HYDROcodone-Acetaminophen 7.5-300 MG Tabs     TAKE these medications   cyclobenzaprine 10 MG tablet Commonly known as:  FLEXERIL Take 1 tablet (10 mg total) by mouth every 8 (eight) hours as needed for muscle spasms.   dicyclomine 20 MG  tablet Commonly known as:  BENTYL Take 1 tablet (20 mg total) by mouth 2 (two) times daily as needed for spasms.   HM MULTIVITAMIN ADULT GUMMY PO Take 1 each by mouth daily.   ibuprofen 800 MG tablet Commonly known as:  ADVIL,MOTRIN Take 1 tablet (800 mg total) by mouth every 8 (eight) hours as needed for headache, mild pain or cramping. What changed:  reasons to take this   nitrofurantoin (macrocrystal-monohydrate) 100 MG capsule Commonly known as:  MACROBID Take 1 capsule (100 mg total) by mouth 2 (two) times daily for 10 days.   oxyCODONE-acetaminophen 5-325 MG tablet Commonly known as:  PERCOCET/ROXICET Take 1-2 tablets by mouth every 4 (four) hours as needed for severe pain.   UROCARE FOLEY CATHETER STRAP Misc For leg bag            Discharge Care Instructions  (From admission, onward)        Start     Ordered   10/18/17 0000  Discharge wound care:    Comments:  Your incisions have skin glue which will dissolve over time.  You may shower, no baths. Don't use lotions or savs on incision sites.   10/18/17 1404     Follow-up Information    Sanjuana Kava, MD Follow up on 10/23/2017.   Specialty:  Obstetrics and Gynecology Why:  appointment time is 2 p.m.  then 6 weeks post operative visit appointment is November 13, 2017 at 2:30 p.m. Contact information: [address] [place] [address] [phone]           Signed: Caffie Damme 10/18/2017, 2:04 PM

## 2017-10-18 NOTE — Progress Notes (Signed)
2 Days Post-Op Procedure(s) (LRB): TOTAL LAPAROSCOPIC HYSTERECTOMY WITH SALPINGECTOMY (Bilateral) CYSTOSCOPY (N/A) LAPAROSCOPIC LYSIS OF ADHESIONS (N/A) REPAIR OF CYSTOTOMY (N/A)  Subjective: Patient ambulatory, she denies shortness of breath presently.  +flatus, no chest pain, tolerating po.  She reports feeling "sore"  Objective: I have reviewed patient's vital signs, intake and output and labs.  General: alert, cooperative and no distress GI: soft, appropriately tender mild distention, incisions clean, dry and intact Extremities: extremities normal, atraumatic, no cyanosis or edema and Homans sign is negative, no sign of DVT Vaginal Bleeding: none  Assessment: s/p Procedure(s): TOTAL LAPAROSCOPIC HYSTERECTOMY WITH SALPINGECTOMY (Bilateral) CYSTOSCOPY (N/A) LAPAROSCOPIC LYSIS OF ADHESIONS (N/A) REPAIR OF CYSTOTOMY (N/A): stable  SPIRAL CT - negative for PE and overall negative Plan: Discharge home    LOS: 1 day    Ronnel Zuercher, [place] 10/18/2017, 1:48 PM

## 2017-10-23 ENCOUNTER — Other Ambulatory Visit: Payer: Self-pay | Admitting: Obstetrics & Gynecology

## 2017-10-23 DIAGNOSIS — S3720XA Unspecified injury of bladder, initial encounter: Secondary | ICD-10-CM

## 2017-10-23 MED ORDER — DIATRIZOATE MEGLUMINE 30 % UR SOLN: Freq: Once | URETHRAL | Status: AC | PRN

## 2017-10-24 ENCOUNTER — Ambulatory Visit (HOSPITAL_COMMUNITY)
Admission: RE | Admit: 2017-10-24 | Discharge: 2017-10-24 | Disposition: A | Discharge status: Home / Self Care | Payer: BLUE CROSS/BLUE SHIELD | Source: Ambulatory Visit | Attending: Obstetrics & Gynecology | Admitting: Obstetrics & Gynecology

## 2017-10-24 ENCOUNTER — Other Ambulatory Visit: Payer: Self-pay | Admitting: Obstetrics & Gynecology

## 2017-10-24 DIAGNOSIS — S3720XA Unspecified injury of bladder, initial encounter: Secondary | ICD-10-CM | POA: Diagnosis present

## 2017-10-24 DIAGNOSIS — Z90710 Acquired absence of both cervix and uterus: Secondary | ICD-10-CM | POA: Insufficient documentation

## 2017-10-24 DIAGNOSIS — X58XXXA Exposure to other specified factors, initial encounter: Secondary | ICD-10-CM | POA: Insufficient documentation

## 2017-10-24 MED ORDER — IOTHALAMATE MEGLUMINE 17.2 % UR SOLN
250.0000 mL | Freq: Once | URETHRAL | Status: AC | PRN
  Administered 2017-10-24: 240 mL via INTRAVESICAL

## 2017-10-31 NOTE — Brief Op Note (Signed)
10/16/2017  5:27 PM  PATIENT:  Lori Andrews  42 y.o. female  PRE-OPERATIVE DIAGNOSIS:  Abnormal Uterine and Vaginal Bleeding  POST-OPERATIVE DIAGNOSIS:  Abnormal Uterine and Vaginal Bleeding  PROCEDURE:  Procedure(s): TOTAL LAPAROSCOPIC HYSTERECTOMY WITH SALPINGECTOMY (Bilateral) CYSTOSCOPY (N/A) LAPAROSCOPIC LYSIS OF ADHESIONS (N/A) REPAIR OF CYSTOTOMY (N/A)  SURGEON:  Surgeon(s) and Role: Panel 1:    * Sanjuana Kava, MD - Primary    * Everett Graff, MD - Assisting Panel 2:    Everett Graff, MD - Primary  PHYSICIAN ASSISTANT: Earnstine Regal   ANESTHESIA:   local and general  EBL:  125 mL   BLOOD ADMINISTERED:none  DRAINS: Urinary Catheter (Foley)   LOCAL MEDICATIONS USED:  MARCAINE    and Amount: 30 ml  SPECIMEN:  Source of Specimen:  uterus, cervix, bilateral fallopian tubes  DISPOSITION OF SPECIMEN:  PATHOLOGY  COUNTS:  YES  TOURNIQUET:  * No tourniquets in log *  DICTATION: .Note written in EPIC  PLAN OF CARE: Admit to inpatient   PATIENT DISPOSITION:  PACU - hemodynamically stable.   Delay start of Pharmacological VTE agent (>24hrs) due to surgical blood loss or risk of bleeding: not applicable  Amaree Loisel STACIA

## 2017-11-04 NOTE — Op Note (Signed)
Preop Diagnosis: Incidental Cystotomy  Postop Diagnosis: Incidental Cystotomy  Procedure: Laparoscopic Cystotomy Repair   Anesthesia: General   Attending: Dr. Sanjuana Kava   Assistant: Dr. Everett Graff  Findings: Approximately 1cm cystotomy in dome of bladder  Pathology: N/a  Fluids: See flowsheet  UOP: See flowsheet  EBL: Minimal  Complications: None  Procedure:  I was asked by Dr. Alwyn Pea to assist with cystotomy repair.  I placed several (approx 5-6 stitches) laparoscopic stitches to close the primary site.  Retrograde sterile milk was placed in the bladder and filled to approx. 300cc.  No bladder leak was noted.  I went to do a delivery and upon my return Dr. Alwyn Pea had placed several imbricating stitches laparoscopically on the mid to left lateral side of the repair.  Cystoscopy was performed and good approximation of the mucosa was noted and bilateral ureters were noted to efflux without diificulty.

## 2017-11-08 NOTE — Op Note (Signed)
OPERATIVE NOTE  Patient:     Lori Andrews, Lori Andrews  DOB:    1976/07/14  MRN:    [id number]   Date of Surgery:  10/18/17  Preoperative Diagnosis: Chronic pelvic pain Endometriosis  Postoperative Diagnosis: Same as above plus severe pelvic adhesive disease Cystotomy  Procedure: Total Laparoscopic hysterectomy Bilateral salpingectomy Cystotomy repair Cystoscopy  Surgeon: Mady Haagensen. Alwyn Pea, M.D.  Assistants: Everett Graff M.D Earnstine Regal PA  Anesthesia: General ETA and local   Indication: Patient with chronic pelvic pain for many years with known diagnosis of pelvic adhesive disease and endometriosis from previous laparoscopy.  Patient failed conservative management with birth control pills and Depo Provera. She denies future fertility.    Findings: Exam under anesthesia: External vulva: Normal Vaginal vault: normal  Cervix: small, nulliparous  14 week size uterus, midline, decreased mobility.  Adnexa: no palpable masses.   Laparoscopy:  Severe pelvic adhesive disease with the right adnexa adherent to the posterior aspect of the uterus.  The bladder is completely adherent to the anterior wall of the uterus.  There are endometriotic implants along the uterosacral ligaments and in the Pouch of [place].  The appendix is also adhered to the right pelvic side wall. The appendix appears normal. The liver edge is normal.    Disposition: The patient presents with the above-mentioned diagnosis. She understands the indications for surgical procedure.  She also understands the alternative treatment options. She accepts the risk of, but not limited to, anesthetic complications, bleeding, infections, and possible damage to the surrounding organs.   Indication: 42 yo with long standing history of abnormal uterine bleeding. Pelvic Ultrasound shows fibroid uterus possible adenomyosis.     Findings: Laparoscopy: Enlarged 16 week size globular appearing uterus with small fibroids visible.  Normal  bilateral ovaries.  Normal appearing fallopian tubes bilaterally.  Normal appearing appendix and liver edge.   During the hysterectomy, inadvertent cystotomy was performed on the left side of the dome of the bladder. It was repaired after the hysterectomy laparoscopically and was water tight.  Cystoscopy demonstrates normal bladder urothelium, water tight mucosa and no suture visible.  Brisk jets from both ureteral orifices.    Procedure: The patient was brought into the operating room with an intravenous line in placed, and she was placed in a low dorsal lithotomy position using Allen stirrups while awake, her arms were also tucked. Patient was given general anesthesia and the patient was examined under anesthesia with findings noted above. The patient's  abdomen was prepped with ChloraPrep. The perineum and vagina were prepped with multiple layers of Betadine.   A  weighted speculum was placed in the posterior vagina and the anterior vagina was retracted with a Deaver retractor. The anterior lip of the cervix was held with a single tooth tenaculum. The uterus was sounded to 9cm  and dilated with [facility] dilators. The tenaculum was removed and the anterior lip of the cervix held with a stitch of 0-vicryl. 3.5 cm Advincula uterine manipulator was placed and the intrauterine balloon inflated. The  Speculum was removed and the bladder catheterized with a foley.    Surgeons gloves and gown were changed and attention was turned to the abdomen. The patient was sterilely draped. The subumbilical area was injected with half percent Marcaine.  An incision was made in the umbilicus with 11 blade scalpel after infiltration with 0.25% Marcaine. A 65mm Excel Visiport trocar was used with l37mm 0 degree laparoscope attached to perform direct entry into the patient's abdomen. Direct video visualization confirmed  entry into the abdomen and pneumoperitoneum was obtained using approximately 3L CO2 gas. The patient was placed in  steep Trendelenburg position. A small incision was made and a 5 mm trocar was inserted into the abdominal cavity along the right pelvis under direct visualization.  The 81mm Air seal trocar was placed along the lower left abdomen and another left 39mm mid abdominal trocar was placed. There was no injury noted with placement of trocars. The pelvic contents were visualized and findings noted above, both ureters were identified crossing the pelvic brim and pelvic sidewall coursing well below operative field.  Pictures were taken of the patient's pelvic structures.     The left fallopian tube was identified and followed to its fimbriated end. The proximal portion of the left fallopian tube was cauterized and cut. The left round ligament was cauterized and transected. The left utero-ovarian ligament was cauterized and cut. Attempt was made to enter the anterior broad ligament to develop the bladder flap.  As there was dense anterior adhesive disease, the plain was not clear.  The incision was made high up on the lower uterine segment to prevent injury to the bladder, however it was noted that there was a 2 cm defect in the bladder as the foley bulb was visualized.   A 16 French foley catheter was placed. We called Dr. Mancel Bale in the OR to assist with the repair.  We continued on to finish the hysterectomy as we used the cystotomy to delineate the plain of the vesicouterine peritoneum.  The anterior broad ligament was then developed and the bladder flap created.   The right fallopian tube was identified and followed to its fimbriated end. The proximal portion of the right fallopian tube was cauterized and cut. The right upper pedicle was then isolated. The right round ligament was then cauterized and transected via the Ligasure. The anterior broad ligament was then isolated and the the mesovarium was skeletonized. A bladder flap was mobilized by the endoshears attached to monopolar cautery and the peritoneum on the  vesicouterine fold was incised to mobilize the bladder.The right utero-ovarian ligament was cauterized and cut. The bladder flap was developed further. Once the [facility] colpotomy ring was skeletonized and in position, the uterine arteries were sealed and transected using the 56mm Ligasure at the level of the colpotomy ring.  The uterine arteries were coagulated and transected bilaterally.   The vagina was transected using the monopolar endoshears resulting in separation of the uterus and attached tubes. The uterus, cervix and tubes were then delivered through the vagina. The vaginal occluder was insufflated with 60 cc of water and the vaginal vault was laparoscopically with 0-v-loc suture.   A modified colposuspension was performed by incorporating the uterosacral ligaments at the angles of the vaginal cuff. The surgical technician confirmed that the vault was closed by placing a finger in the vagina and there were no notable defects. Irrigation was performed and the vaginal cuff and the pedicles were noted to be hemostatic.      The cystotomy repair was then performed laparoscopically in two layers,  Utilizing interrupted sutures of 3-0 chromic. This was done via extracorporeal knot tying.  The bladder was back-filled with sterile milk and there was no noted spillage of milk after the repair.     A cystoscopy was then performed using a 70 degree cystoscopy and a complete bladder survey was performed. There was no noted suture in bladder urothelium and it was water tight and there were brisk jets  from both ureteral orifices.    The accessory ports were removed under direct laparoscopic guidance and  the pneumoperitoneum was released. The umbilical port was removed using laparoscopic guidance. The umbilical fascia was closed with an interrupted figure-of-eight stitch using 0 Vicryl on a UR-6. The skin was closed with subcuticular stitches using 4-0 Monocryl suture. Dermabond was used to cover each incision site  and op sites placed.  The final sponge, needle, and instrument counts were correct at the completion of the procedure. The patient tolerated the procedure well and was awakened and taken to the post anesthesia care unit in stable condition.    Patient counseled after the surgery about the bladder injury and she would have to have the foley catheter for 7-10 days. We will perform a VCUG to confirm complete healing of the bladder prior to pulling the catheter.        Cinch Ormond STACIA

## 2017-11-14 ENCOUNTER — Other Ambulatory Visit: Payer: Self-pay | Admitting: Obstetrics & Gynecology

## 2017-11-14 DIAGNOSIS — R109 Unspecified abdominal pain: Secondary | ICD-10-CM

## 2017-11-18 ENCOUNTER — Other Ambulatory Visit: Payer: BLUE CROSS/BLUE SHIELD

## 2017-12-11 ENCOUNTER — Inpatient Hospital Stay (HOSPITAL_COMMUNITY)
Admission: AD | Admit: 2017-12-11 | Discharge: 2017-12-11 | Disposition: A | Discharge status: Home / Self Care | Payer: BLUE CROSS/BLUE SHIELD | Source: Ambulatory Visit | Attending: Obstetrics and Gynecology | Admitting: Obstetrics and Gynecology

## 2017-12-11 ENCOUNTER — Inpatient Hospital Stay (HOSPITAL_COMMUNITY): Payer: BLUE CROSS/BLUE SHIELD

## 2017-12-11 ENCOUNTER — Encounter (HOSPITAL_COMMUNITY): Payer: Self-pay

## 2017-12-11 DIAGNOSIS — Z9889 Other specified postprocedural states: Secondary | ICD-10-CM | POA: Insufficient documentation

## 2017-12-11 DIAGNOSIS — G8918 Other acute postprocedural pain: Secondary | ICD-10-CM

## 2017-12-11 DIAGNOSIS — Z90710 Acquired absence of both cervix and uterus: Secondary | ICD-10-CM | POA: Diagnosis not present

## 2017-12-11 LAB — URINALYSIS, ROUTINE W REFLEX MICROSCOPIC
BACTERIA UA: NONE SEEN
BILIRUBIN URINE: NEGATIVE
Glucose, UA: NEGATIVE mg/dL
HGB URINE DIPSTICK: NEGATIVE
KETONES UR: NEGATIVE mg/dL
NITRITE: NEGATIVE
Protein, ur: NEGATIVE mg/dL
SPECIFIC GRAVITY, URINE: 1.031 — AB (ref 1.005–1.030)
pH: 6 (ref 5.0–8.0)

## 2017-12-11 MED ORDER — IOPAMIDOL (ISOVUE-300) INJECTION 61%
100.0000 mL | Freq: Once | INTRAVENOUS | Status: AC | PRN
  Administered 2017-12-11: 100 mL via INTRAVENOUS

## 2017-12-11 NOTE — Progress Notes (Signed)
Dr Alwyn Pea called and requested for me to place discharge order. She managed the patient.

## 2017-12-11 NOTE — MAU Note (Signed)
Pt reports she is s/p hysterectomy 03/06, nicked bladder, continues to have dysuria, and continues to have a lot of pain mid abd and to the left. Denies fever.

## 2017-12-13 LAB — URINE CULTURE

## 2018-12-01 ENCOUNTER — Other Ambulatory Visit: Payer: Self-pay | Admitting: Obstetrics & Gynecology

## 2018-12-01 DIAGNOSIS — K439 Ventral hernia without obstruction or gangrene: Secondary | ICD-10-CM

## 2018-12-04 ENCOUNTER — Other Ambulatory Visit: Payer: Self-pay

## 2018-12-04 ENCOUNTER — Ambulatory Visit
Admission: RE | Admit: 2018-12-04 | Discharge: 2018-12-04 | Disposition: A | Discharge status: Home / Self Care | Payer: BLUE CROSS/BLUE SHIELD | Source: Ambulatory Visit | Attending: Obstetrics & Gynecology | Admitting: Obstetrics & Gynecology

## 2018-12-04 DIAGNOSIS — K439 Ventral hernia without obstruction or gangrene: Secondary | ICD-10-CM

## 2018-12-04 MED ORDER — IOPAMIDOL (ISOVUE-300) INJECTION 61%
100.0000 mL | Freq: Once | INTRAVENOUS | Status: AC | PRN
  Administered 2018-12-04: 12:00:00 100 mL via INTRAVENOUS

## 2021-03-01 ENCOUNTER — Encounter (HOSPITAL_BASED_OUTPATIENT_CLINIC_OR_DEPARTMENT_OTHER): Payer: Self-pay

## 2021-03-01 ENCOUNTER — Other Ambulatory Visit (HOSPITAL_BASED_OUTPATIENT_CLINIC_OR_DEPARTMENT_OTHER): Payer: Self-pay

## 2021-03-01 ENCOUNTER — Other Ambulatory Visit: Payer: Self-pay

## 2021-03-01 ENCOUNTER — Emergency Department (HOSPITAL_BASED_OUTPATIENT_CLINIC_OR_DEPARTMENT_OTHER)
Admission: EM | Admit: 2021-03-01 | Discharge: 2021-03-01 | Disposition: A | Discharge status: Home / Self Care | Payer: BLUE CROSS/BLUE SHIELD | Attending: Emergency Medicine | Admitting: Emergency Medicine

## 2021-03-01 DIAGNOSIS — U071 COVID-19: Secondary | ICD-10-CM | POA: Insufficient documentation

## 2021-03-01 LAB — RESP PANEL BY RT-PCR (FLU A&B, COVID) ARPGX2
Influenza A by PCR: NEGATIVE
Influenza B by PCR: NEGATIVE
SARS Coronavirus 2 by RT PCR: POSITIVE — AB

## 2021-03-01 MED ORDER — IBUPROFEN 400 MG PO TABS
600.0000 mg | ORAL_TABLET | Freq: Once | ORAL | Status: AC
  Administered 2021-03-01: 600 mg via ORAL
  Filled 2021-03-01: qty 1

## 2021-03-01 MED ORDER — NIRMATRELVIR/RITONAVIR (PAXLOVID)TABLET
3.0000 | ORAL_TABLET | Freq: Two times a day (BID) | ORAL | 0 refills | Status: DC
  Filled 2021-03-01: qty 30, 5d supply, fill #0

## 2021-03-01 MED ORDER — MOLNUPIRAVIR EUA 200MG CAPSULE
ORAL_CAPSULE | ORAL | 0 refills | Status: AC
  Filled 2021-03-01: qty 40, 5d supply, fill #0

## 2021-03-01 NOTE — ED Provider Notes (Signed)
[facility] EM[facility]ENC[facility] DEPARTMENT Provider Note   CSN: [id number] Arrival date & time: 03/01/21  0803     History Chief Complaint  Patient presents with   Fever   Cough   Generalized Body Aches    Annali Lybrand is a 45 y.o. female.  Patient is a 45 year old female who presents with cough and fever.  This is been going on for the past 3 days.  She reports chills, body aches, congestion and sore throat.  Not really having a runny nose.  Her ears feel itchy.  She was recently around her sister who had similar symptoms.  She is not sure if she tested positive for anything.  She has been using over-the-counter medicines with some improvement in symptoms.  She has some mild shortness of breath with the congestion.  She does not have any chest pain other than when she coughs she feels a little burning in her chest.      Past Medical History:  Diagnosis Date   Anemia    Chlamydia    Over ten years ago   Complication of anesthesia    Difficulty waking up from anesthetic   Endometriosis 02/29/2012   Ovarian cyst    Pelvic pain in female 02/29/2012   Trichimoniasis    Over 10 years ago    Patient Active Problem List   Diagnosis Date Noted   Status post laparoscopy 10/17/2017   Chronic pelvic pain in female 10/16/2017   Fibroids, intramural 03/26/2016   Fe deficiency anemia 09/01/2012   Endometriosis 02/29/2012   Pelvic pain in female 02/29/2012    Past Surgical History:  Procedure Laterality Date   BLADDER REPAIR N/A 10/16/2017   Procedure: REPAIR OF CYSTOTOMY;  Surgeon: Everett Graff, MD;  Location: [facility];  Service: Gynecology;  Laterality: N/A;   BREAST LUMPECTOMY     BREAST SURGERY     Lump removed at 45 yo   CYSTOSCOPY N/A 10/16/2017   Procedure: CYSTOSCOPY;  Surgeon: Sanjuana Kava, MD;  Location: [facility];  Service: Gynecology;  Laterality: N/A;   DILATION AND CURETTAGE OF UTERUS  02/29/2012   Procedure: DILATATION AND CURETTAGE;  Surgeon: Elveria Royals, MD;   Location: [facility];  Service: Gynecology;  Laterality: N/A;   ENDOMETRIAL ABLATION  02/29/2012   Procedure: ENDOMETRIAL ABLATION;  Surgeon: Elveria Royals, MD;  Location: [facility];  Service: Gynecology;  Laterality: N/A;   LAPAROSCOPIC LYSIS OF ADHESIONS N/A 10/16/2017   Procedure: LAPAROSCOPIC LYSIS OF ADHESIONS;  Surgeon: Sanjuana Kava, MD;  Location: [facility];  Service: Gynecology;  Laterality: N/A;   LAPAROSCOPY  02/29/2012   Procedure: LAPAROSCOPY OPERATIVE;  Surgeon: Elveria Royals, MD;  Location: [facility];  Service: Gynecology;  Laterality: N/A;  w/ Chromopertubation   TOTAL LAPAROSCOPIC HYSTERECTOMY WITH SALPINGECTOMY Bilateral 10/16/2017   Procedure: TOTAL LAPAROSCOPIC HYSTERECTOMY WITH SALPINGECTOMY;  Surgeon: Sanjuana Kava, MD;  Location: [facility];  Service: Gynecology;  Laterality: Bilateral;   WISDOM TOOTH EXTRACTION       OB History     Gravida  0   Para  0   Term  0   Preterm  0   AB  0   Living  0      SAB  0   IAB  0   Ectopic  0   Multiple  0   Live Births              Family History  Problem Relation Age of Onset   Diabetes Mother  Diabetes Father    Hyperlipidemia Father    Diabetes Sister     Social History   Tobacco Use   Smoking status: Never   Smokeless tobacco: Never  Vaping Use   Vaping Use: Never used  Substance Use Topics   Alcohol use: Yes    Comment: Occasionallly    Drug use: No    Types: Marijuana    Comment: Last use in 09/2017    Home Medications Prior to Admission medications   Medication Sig Start Date End Date Taking? Authorizing Provider  nirmatrelvir/ritonavir EUA (PAXLOVID) TABS Take 3 tablets by mouth 2 (two) times daily for 5 days. Patient GFR is normal when last checked. Take nirmatrelvir (150 mg) two tablets twice daily for 5 days and ritonavir (100 mg) one tablet twice daily for 5 days. 03/01/21 03/06/21 Yes Malvin Johns, MD  cetirizine (ZYRTEC) 10 MG tablet Take 10 mg by mouth daily.    [provider]   cyclobenzaprine (FLEXERIL) 10 MG tablet Take 1 tablet (10 mg total) by mouth every 8 (eight) hours as needed for muscle spasms. 10/18/17   Sanjuana Kava, MD  diphenhydrAMINE (BENADRYL) 25 MG tablet Take 25 mg by mouth every 6 (six) hours as needed for allergies (before pain medication for itching).    [provider]  fluticasone (FLONASE) 50 MCG/ACT nasal spray Place 1 spray into both nostrils daily.    [provider]  HYDROcodone-Acetaminophen 5-300 MG TABS TAKE 1 TABLET BY MOUTH EVERY 8 HOURS AS NEEDED FOR PAIN    [provider]  ibuprofen (ADVIL,MOTRIN) 800 MG tablet Take 1 tablet (800 mg total) by mouth every 8 (eight) hours as needed for headache, mild pain or cramping. 10/18/17   Sanjuana Kava, MD  Incontinence Supplies (UROCARE FOLEY CATHETER STRAP) MISC For leg bag 10/18/17   Sanjuana Kava, MD  oxyCODONE-acetaminophen (PERCOCET/ROXICET) 5-325 MG tablet Take 1-2 tablets by mouth every 4 (four) hours as needed for severe pain. 10/18/17   Sanjuana Kava, MD    Allergies    Codeine, Red dye, and Tramadol  Review of Systems   Review of Systems  Constitutional:  Positive for appetite change, chills, fatigue and fever. Negative for diaphoresis.  HENT:  Positive for congestion. Negative for rhinorrhea and sneezing.   Eyes: Negative.   Respiratory:  Positive for cough and shortness of breath. Negative for chest tightness.   Cardiovascular:  Negative for chest pain and leg swelling.  Gastrointestinal:  Negative for abdominal pain, blood in stool, diarrhea, nausea and vomiting.  Genitourinary:  Negative for difficulty urinating, flank pain, frequency and hematuria.  Musculoskeletal:  Positive for myalgias. Negative for arthralgias and back pain.  Skin:  Negative for rash.  Neurological:  Positive for headaches. Negative for dizziness, speech difficulty, weakness and numbness.   Physical Exam Updated Vital Signs BP (!) 156/90 (BP Location: Left Arm)   Pulse (!) 103   Temp  100.3 F (37.9 C) (Oral)   Resp 20   Wt 84.8 kg   LMP 06/07/2016   SpO2 95%   BMI 33.13 kg/m   Physical Exam Constitutional:      Appearance: She is well-developed.  HENT:     Head: Normocephalic and atraumatic.     Right Ear: Tympanic membrane normal.     Left Ear: Tympanic membrane normal.     Mouth/Throat:     Mouth: Mucous membranes are moist.     Pharynx: No oropharyngeal exudate or posterior oropharyngeal erythema.  Eyes:  Pupils: Pupils are equal, round, and reactive to light.  Cardiovascular:     Rate and Rhythm: Normal rate and regular rhythm.     Heart sounds: Normal heart sounds.  Pulmonary:     Effort: Pulmonary effort is normal. No respiratory distress.     Breath sounds: Normal breath sounds. No wheezing or rales.     Comments: No increased work of breathing, no tachypnea Chest:     Chest wall: No tenderness.  Abdominal:     General: Bowel sounds are normal.     Palpations: Abdomen is soft.     Tenderness: There is no abdominal tenderness. There is no guarding or rebound.  Musculoskeletal:        General: Normal range of motion.     Cervical back: Normal range of motion and neck supple.     Right lower leg: No edema.     Left lower leg: No edema.  Lymphadenopathy:     Cervical: No cervical adenopathy.  Skin:    General: Skin is warm and dry.     Findings: No rash.  Neurological:     Mental Status: She is alert and oriented to person, place, and time.    ED Results / Procedures / Treatments   Labs (all labs ordered are listed, but only abnormal results are displayed) Labs Reviewed  RESP PANEL BY RT-PCR (FLU A&B, COVID) ARPGX2 - Abnormal; Notable for the following components:      Result Value   SARS Coronavirus 2 by RT PCR POSITIVE (*)    All other components within normal limits    EKG None  Radiology No results found.  Procedures Procedures   Medications Ordered in ED Medications  ibuprofen (ADVIL) tablet 600 mg (600 mg Oral Given  03/01/21 3903)    ED Course  I have reviewed the triage vital signs and the nursing notes.  Pertinent labs & imaging results that were available during my care of the patient were reviewed by me and considered in my medical decision making (see chart for details).    MDM Rules/Calculators/A&P                           Patient is a 45 year old female who presents with a flulike illness.  She reports some mild shortness of breath.  She has no increased work of breathing.  No hypoxia.  Her lungs are clear on exam.  Her COVID test is positive.  She does not have any significant other health problems.  I discussed Paxlovid and she is opting for treatment with this.  Her last labs were about a year ago and she had normal renal function at that time.  She does not have any known kidney disease.  She was discharged home in good condition.  Symptomatic care instructions, quarantine instructions, and return precautions were given. Final Clinical Impression(s) / ED Diagnoses Final diagnoses:  [id number] virus infection    Rx / DC Orders ED Discharge Orders          Ordered    nirmatrelvir/ritonavir EUA (PAXLOVID) TABS  2 times daily        03/01/21 1027             Malvin Johns, MD 03/01/21 1029

## 2021-03-01 NOTE — ED Triage Notes (Signed)
Pt reports fever, cough, chills, and body aches over the past 3 days.

## 2022-08-19 ENCOUNTER — Emergency Department (HOSPITAL_BASED_OUTPATIENT_CLINIC_OR_DEPARTMENT_OTHER)
Admission: EM | Admit: 2022-08-19 | Discharge: 2022-08-19 | Disposition: A | Discharge status: Home / Self Care | Payer: Self-pay | Attending: Emergency Medicine | Admitting: Emergency Medicine

## 2022-08-19 ENCOUNTER — Other Ambulatory Visit: Payer: Self-pay

## 2022-08-19 ENCOUNTER — Emergency Department (HOSPITAL_BASED_OUTPATIENT_CLINIC_OR_DEPARTMENT_OTHER): Payer: Self-pay

## 2022-08-19 ENCOUNTER — Encounter (HOSPITAL_BASED_OUTPATIENT_CLINIC_OR_DEPARTMENT_OTHER): Payer: Self-pay | Admitting: Emergency Medicine

## 2022-08-19 DIAGNOSIS — Z1152 Encounter for screening for COVID-19: Secondary | ICD-10-CM | POA: Insufficient documentation

## 2022-08-19 DIAGNOSIS — R1032 Left lower quadrant pain: Secondary | ICD-10-CM | POA: Insufficient documentation

## 2022-08-19 DIAGNOSIS — D72829 Elevated white blood cell count, unspecified: Secondary | ICD-10-CM | POA: Insufficient documentation

## 2022-08-19 DIAGNOSIS — R197 Diarrhea, unspecified: Secondary | ICD-10-CM | POA: Insufficient documentation

## 2022-08-19 LAB — COMPREHENSIVE METABOLIC PANEL
ALT: 40 U/L (ref 0–44)
AST: 26 U/L (ref 15–41)
Albumin: 3.7 g/dL (ref 3.5–5.0)
Alkaline Phosphatase: 78 U/L (ref 38–126)
Anion gap: 9 (ref 5–15)
BUN: 9 mg/dL (ref 6–20)
CO2: 23 mmol/L (ref 22–32)
Calcium: 8.2 mg/dL — ABNORMAL LOW (ref 8.9–10.3)
Chloride: 104 mmol/L (ref 98–111)
Creatinine, Ser: 0.76 mg/dL (ref 0.44–1.00)
GFR, Estimated: >60 mL/min (ref >60)
Glucose, Bld: 94 mg/dL (ref 70–99)
Potassium: 3.6 mmol/L (ref 3.5–5.1)
Sodium: 136 mmol/L (ref 135–145)
Total Bilirubin: 0.2 mg/dL — ABNORMAL LOW (ref 0.3–1.2)
Total Protein: 7.4 g/dL (ref 6.5–8.1)

## 2022-08-19 LAB — URINALYSIS, ROUTINE W REFLEX MICROSCOPIC
Bilirubin Urine: NEGATIVE
Glucose, UA: NEGATIVE mg/dL
Hgb urine dipstick: NEGATIVE
Ketones, ur: 15 mg/dL — AB
Leukocytes,Ua: NEGATIVE
Nitrite: NEGATIVE
Protein, ur: NEGATIVE mg/dL
Specific Gravity, Urine: 1.01 (ref 1.005–1.030)
pH: 5.5 (ref 5.0–8.0)

## 2022-08-19 LAB — CBC
HCT: 41.5 % (ref 36.0–46.0)
Hemoglobin: 13.1 g/dL (ref 12.0–15.0)
MCH: 25.5 pg — ABNORMAL LOW (ref 26.0–34.0)
MCHC: 31.6 g/dL (ref 30.0–36.0)
MCV: 80.9 fL (ref 80.0–100.0)
Platelets: 234 10*3/uL (ref 150–400)
RBC: 5.13 MIL/uL — ABNORMAL HIGH (ref 3.87–5.11)
RDW: 14.3 % (ref 11.5–15.5)
WBC: 12 10*3/uL — ABNORMAL HIGH (ref 4.0–10.5)
nRBC: 0 % (ref 0.0–0.2)

## 2022-08-19 LAB — RESP PANEL BY RT-PCR (RSV, FLU A&B, COVID)  RVPGX2
Influenza A by PCR: NEGATIVE
Influenza B by PCR: NEGATIVE
Resp Syncytial Virus by PCR: NEGATIVE
SARS Coronavirus 2 by RT PCR: NEGATIVE

## 2022-08-19 LAB — OCCULT BLOOD X 1 CARD TO LAB, STOOL: Fecal Occult Bld: NEGATIVE

## 2022-08-19 LAB — LIPASE, BLOOD: Lipase: 33 U/L (ref 11–51)

## 2022-08-19 MED ORDER — IOHEXOL 300 MG/ML  SOLN
100.0000 mL | Freq: Once | INTRAMUSCULAR | Status: AC | PRN
  Administered 2022-08-19: 100 mL via INTRAVENOUS

## 2022-08-19 MED ORDER — SODIUM CHLORIDE 0.9 % IV BOLUS
1000.0000 mL | Freq: Once | INTRAVENOUS | Status: AC
  Administered 2022-08-19: 1000 mL via INTRAVENOUS

## 2022-08-19 MED ORDER — ONDANSETRON 4 MG PO TBDP: 4.0000 mg | ORAL_TABLET | Freq: Three times a day (TID) | ORAL | 0 refills | Status: AC | PRN

## 2022-08-19 NOTE — ED Notes (Signed)
Introduced myself to patient. Assisted her to the bathroom for stool sample.

## 2022-08-19 NOTE — ED Notes (Signed)
Pt refused Resp panel swab as she just had one earlier today; EDP aware

## 2022-08-19 NOTE — ED Provider Notes (Signed)
[facility] Provider Note   CSN: [id number] Arrival date & time: 08/19/22  1105     History  Chief Complaint  Patient presents with   Abdominal Pain    Lori Andrews is a 47 y.o. female.  With a history of endometriosis, anemia who presents ED for evaluation of generalized abdominal pain, dark color diarrhea, body aches for the past 4 days.  She presented to urgent care and was encouraged to come to the ED for further evaluation due to bilirubin being noted in her urine.  Abdominal pain is more in the left lower quadrant, however sometimes moves around.  She describes this as aching.  Currently rates it at a 2 out of 10.  Has had some nausea, but currently does not have any nausea.  Has been unable to eat like normal for the past 4 days due to lack of appetite.  Has a history of hysterectomy.  Denies smoking, alcohol use, recreational drug use.  Denies bright red blood per rectum.  States her stool is watery and very dark.  Has been having 2-3 episodes of diarrhea per day.  States she has "just been feeling bad for the past 4 days."  Denies sick contacts.  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  Urgent care also tested her for flu and COVID which which she was negative for.  Has been taking Pepto-Bismol for the pain which may account for her dark stools.  Has not had any antibiotics recently.   Abdominal Pain Associated symptoms: diarrhea, fatigue and nausea        Home Medications Prior to Admission medications   Medication Sig Start Date End Date Taking? Authorizing Provider  ondansetron (ZOFRAN-ODT) 4 MG disintegrating tablet Take 1 tablet (4 mg total) by mouth every 8 (eight) hours as needed for nausea or vomiting. 08/19/22  Yes Charbel Los, Grafton Folk, PA-C  cetirizine (ZYRTEC) 10 MG tablet Take 10 mg by mouth daily.    [provider]  cyclobenzaprine (FLEXERIL) 10 MG tablet Take 1 tablet (10 mg total) by mouth every 8 (eight) hours as needed for muscle  spasms. 10/18/17   Sanjuana Kava, MD  diphenhydrAMINE (BENADRYL) 25 MG tablet Take 25 mg by mouth every 6 (six) hours as needed for allergies (before pain medication for itching).    [provider]  fluticasone (FLONASE) 50 MCG/ACT nasal spray Place 1 spray into both nostrils daily.    [provider]  HYDROcodone-Acetaminophen 5-300 MG TABS TAKE 1 TABLET BY MOUTH EVERY 8 HOURS AS NEEDED FOR PAIN    [provider]  ibuprofen (ADVIL,MOTRIN) 800 MG tablet Take 1 tablet (800 mg total) by mouth every 8 (eight) hours as needed for headache, mild pain or cramping. 10/18/17   Sanjuana Kava, MD  Incontinence Supplies (UROCARE FOLEY CATHETER STRAP) [place] For leg bag 10/18/17   Sanjuana Kava, MD  molnupiravir EUA 200 mg CAPS Take 4 capsules by mouth twice a day for 5 days 03/01/21   Malvin Johns, MD  oxyCODONE-acetaminophen (PERCOCET/ROXICET) 5-325 MG tablet Take 1-2 tablets by mouth every 4 (four) hours as needed for severe pain. 10/18/17   Sanjuana Kava, MD      Allergies    Codeine, Red dye, and Tramadol    Review of Systems   Review of Systems  Constitutional:  Positive for fatigue.  Gastrointestinal:  Positive for abdominal pain, diarrhea and nausea.  All other systems reviewed and are negative.   Physical Exam Updated Vital Signs BP 132/72  Pulse 90   Temp 98.4 F (36.9 C) (Oral)   Resp 17   Wt 88 kg   LMP 06/07/2016   SpO2 98%   BMI 34.37 kg/m  Physical Exam Vitals and nursing note reviewed.  Constitutional:      General: She is not in acute distress.    Appearance: She is well-developed. She is obese. She is not ill-appearing, toxic-appearing or diaphoretic.     Comments: Resting comfortably in bed.  HENT:     Head: Normocephalic and atraumatic.  Eyes:     Conjunctiva/sclera: Conjunctivae normal.  Cardiovascular:     Rate and Rhythm: Normal rate and regular rhythm.     Heart sounds: No murmur heard. Pulmonary:     Effort: Pulmonary effort is normal. No  respiratory distress.     Breath sounds: Normal breath sounds.  Abdominal:     General: Bowel sounds are normal.     Palpations: Abdomen is soft. There is no hepatomegaly or splenomegaly.     Tenderness: There is abdominal tenderness in the left lower quadrant. There is no right CVA tenderness, left CVA tenderness or guarding. Negative signs include Murphy's sign, Rovsing's sign, McBurney's sign, psoas sign and obturator sign.  Genitourinary:    Rectum: No mass or tenderness.     Comments: No obvious blood.  Dark-colored stool on rectal exam.  No masses or external hemorrhoid. Musculoskeletal:        General: No swelling.     Cervical back: Neck supple.  Skin:    General: Skin is warm and dry.     Capillary Refill: Capillary refill takes less than 2 seconds.  Neurological:     General: No focal deficit present.     Mental Status: She is alert and oriented to person, place, and time.  Psychiatric:        Mood and Affect: Mood normal.     ED Results / Procedures / Treatments   Labs (all labs ordered are listed, but only abnormal results are displayed) Labs Reviewed  COMPREHENSIVE METABOLIC PANEL - Abnormal; Notable for the following components:      Result Value   Calcium 8.2 (*)    Total Bilirubin 0.2 (*)    All other components within normal limits  CBC - Abnormal; Notable for the following components:   WBC 12.0 (*)    RBC 5.13 (*)    MCH 25.5 (*)    All other components within normal limits  URINALYSIS, ROUTINE W REFLEX MICROSCOPIC - Abnormal; Notable for the following components:   Ketones, ur 15 (*)    All other components within normal limits  RESP PANEL BY RT-PCR (RSV, FLU A&B, COVID)  RVPGX2  GASTROINTESTINAL PANEL BY PCR, STOOL (REPLACES STOOL CULTURE)  LIPASE, BLOOD  OCCULT BLOOD X 1 CARD TO LAB, STOOL    EKG EKG Interpretation  Date/Time:  Sunday August 19 2022 12:17:22 EST Ventricular Rate:  88 PR Interval:  141 QRS Duration: 79 QT Interval:  364 QTC  Calculation: 441 R Axis:   35 Text Interpretation: Sinus rhythm Confirmed by Regan Lemming ([id number]) on 08/19/2022 12:23:49 PM  Radiology CT Abdomen Pelvis W Contrast  Result Date: 08/19/2022 CLINICAL DATA:  Abdominal pain.  Dark stool.  Diarrhea. EXAM: CT ABDOMEN AND PELVIS WITH CONTRAST TECHNIQUE: Multidetector CT imaging of the abdomen and pelvis was performed using the standard protocol following bolus administration of intravenous contrast. RADIATION DOSE REDUCTION: This exam was performed according to the departmental dose-optimization program which includes automated  exposure control, adjustment of the mA and/or kV according to patient size and/or use of iterative reconstruction technique. CONTRAST:  193m OMNIPAQUE IOHEXOL 300 MG/ML  SOLN COMPARISON:  CT the abdomen and pelvis December 04, 2018 FINDINGS: Lower chest: No acute abnormality. Hepatobiliary: No focal liver abnormality is seen. No gallstones, gallbladder wall thickening, or biliary dilatation. Pancreas: Unremarkable. No pancreatic ductal dilatation or surrounding inflammatory changes. Spleen: Normal in size without focal abnormality. Adrenals/Urinary Tract: Adrenal glands are unremarkable. Kidneys are normal, without renal calculi, focal lesion, or hydronephrosis. Bladder is unremarkable. Stomach/Bowel: Subtle wall thickening and mucosal enhancement not excluded in the gastric antrum as identified on series 2, image 28. No adjacent fat stranding. No ulceration identified. The stomach is otherwise normal. The small bowel is normal. No small bowel obstruction. There is some fluid in the colon consistent with history of diarrhea. No colonic wall thickening. A few colonic diverticuli are identified without diverticulitis. The appendix is normal with no appendicitis Vascular/Lymphatic: No significant vascular findings are present. No enlarged abdominal or pelvic lymph nodes. Reproductive: The patient is status post partial hysterectomy. The left adnexa  is normal. There is a corpus luteum cyst in the right ovary. No follow-up imaging necessary for the corpus luteum cyst. Other: A small amount of fluid in the pelvis is likely physiologic. No free air. No abnormal fluid collection. Musculoskeletal: No acute or significant osseous findings. IMPRESSION: 1. Subtle wall thickening and mucosal enhancement in the gastric antrum is not excluded. No ulceration or adjacent fat stranding identified. The findings could be artifactual or represent subtle underlying gastritis. Recommend clinical correlation. 2. Fluid in the colon consistent with history of diarrhea. No colonic wall thickening. 3. A few colonic diverticuli are identified without diverticulitis. 4. A small amount of fluid in the pelvis is likely physiologic. 5. No other abnormalities. Electronically Signed   By: DDorise BullionIII M.D.   On: 08/19/2022 16:19    Procedures Procedures    Medications Ordered in ED Medications  sodium chloride 0.9 % bolus 1,000 mL ( Intravenous Stopped 08/19/22 1714)  iohexol (OMNIPAQUE) 300 MG/ML solution 100 mL (100 mLs Intravenous Contrast Given 08/19/22 1556)    ED Course/ Medical Decision Making/ A&P                           Medical Decision Making Amount and/or Complexity of Data Reviewed Labs: ordered. Radiology: ordered.  Risk Prescription drug management.  This patient presents to the ED for concern of abdominal pain, this involves an extensive number of treatment options, and is a complaint that carries with it a high risk of complications and morbidity. The differential diagnosis for generalized abdominal pain includes, but is not limited to AAA, gastroenteritis, appendicitis, Bowel obstruction, Bowel perforation. Gastroparesis, DKA, Hernia, Inflammatory bowel disease, mesenteric ischemia, pancreatitis, peritonitis SBP, volvulus.   Co morbidities that complicate the patient evaluation  endometriosis, anemia  My initial workup includes abdominal  pain labs, CT abdomen pelvis, IV fluids  Additional history obtained from: Nursing notes from this visit.  I ordered, reviewed and interpreted labs which include: CMP, CBC, lipase, urinalysis, Hemoccult card.  Leukocytosis of 12.0.  No anemia.  Urinalysis shows no bilirubinuria  I ordered imaging studies including CT abdomen pelvis I independently visualized and interpreted imaging which showed possible gastritis, fluid in the colon consistent with diarrhea, no acute emergent abnormalities I agree with the radiologist interpretation  Afebrile, hemodynamically stable.  47year old female presents ED for evaluation of  diarrhea and abdominal pain.  She initially had abdominal pain rated a 2 out of 10, but states that it resolved in the ED.  Did not have any nausea in the ED.  Physical exam is remarkable for generalized mild tenderness to palpation of the abdomen, was otherwise unremarkable.  Rectal exam showed dark-colored stool which is likely secondary to her Pepto-Bismol usage.  Hemoccult was negative.  She did have a leukocytosis of 12.0, labs otherwise unremarkable.  She was sent to the ED from UC for further evaluation of bilirubinuria, however urinalysis shows no bilirubin.  CT abdomen pelvis largely unremarkable.  She did pass her p.o. challenge in the ED.  She is not anemic.  Low suspicion for GI bleed.  She states she did eat some questionable food around Christmas, however symptoms began a few days after that.  Does not know of any other questionable food since then.  Workup correlates clinically with gastroenteritis.  Question bacterial gastroenteritis due to leukocytosis and duration.  She was encouraged not to take antidiarrheals until her stool panel has been resulted.  She does have a follow-up appointment already scheduled with her primary care provider in 4 days.  She was encouraged to get in sooner once the stool sample is resulted.  She is encouraged to follow-up with her MyChart for  this.  She was also encouraged to drink plenty of fluids to avoid dehydration.  She denies nausea, but is agreeable to having Zofran prescription sent in order to return to normal diet.  She was given strict return precautions.  Stable at discharge.  At this time there does not appear to be any evidence of an acute emergency medical condition and the patient appears stable for discharge with appropriate outpatient follow up. Diagnosis was discussed with patient who verbalizes understanding of care plan and is agreeable to discharge. I have discussed return precautions with patient who verbalizes understanding. Patient encouraged to follow-up with their PCP within 1 week. All questions answered.  Note: Portions of this report may have been transcribed using voice recognition software. Every effort was made to ensure accuracy; however, inadvertent computerized transcription errors may still be present.         Final Clinical Impression(s) / ED Diagnoses Final diagnoses:  Diarrhea, unspecified type    Rx / DC Orders ED Discharge Orders          Ordered    ondansetron (ZOFRAN-ODT) 4 MG disintegrating tablet  Every 8 hours PRN        08/19/22 1955              Nehemiah Massed 08/19/22 2000    Wyvonnia Dusky, MD 08/19/22 [phone]

## 2022-08-19 NOTE — ED Triage Notes (Signed)
Pt arrives pov, slow gait, referred by UC. Pt c/o diarrhea x 4 days, dark stool, dizziness and fatigue, decreased appetite, RUQ pain x 2 days

## 2022-08-19 NOTE — Discharge Instructions (Signed)
You have been seen today for your complaint of abdominal pain and diarrhea. Your lab work was overall very reassuring. Your imaging was reassuring and showed no abnormalities. Your discharge medications include Zofran.  This is a nausea medicine.  You should take it as needed for nausea and to return to normal diet. Home care instructions are as follows:  Return to a normal diet.  You should start with soft solids and slowly progressed to what she would typically eat in the day.  Drink plenty of water to avoid dehydration. Follow up with: Your primary care provider after your stool sample has been resulted.  You should check your MyChart for this. Please seek immediate medical care if you develop any of the following symptoms: You have a fever. Your diarrhea gets worse. You have new symptoms. You vomit every time you eat or drink. You feel light-headed, dizzy, or have a headache. You have muscle cramps. You have signs of dehydration, such as: Dark urine, very little urine, or no urine. Cracked lips. Dry mouth. Sunken eyes. Sleepiness. Weakness. You have bloody or black stools or stools that look like tar. You have severe pain, cramping, or bloating in your abdomen. Your skin feels cold and clammy. You feel confused. At this time there does not appear to be the presence of an emergent medical condition, however there is always the potential for conditions to change. Please read and follow the below instructions.  Do not take your medicine if  develop an itchy rash, swelling in your mouth or lips, or difficulty breathing; call 911 and seek immediate emergency medical attention if this occurs.  You may review your lab tests and imaging results in their entirety on your MyChart account.  Please discuss all results of fully with your primary care provider and other specialist at your follow-up visit.  Note: Portions of this text may have been transcribed using voice recognition software.  Every effort was made to ensure accuracy; however, inadvertent computerized transcription errors may still be present.

## 2022-08-19 NOTE — ED Notes (Signed)
Chaperoned EDP for rectal exam

## 2022-08-20 LAB — GASTROINTESTINAL PANEL BY PCR, STOOL (REPLACES STOOL CULTURE)
